# Patient Record
Sex: Female | Born: 1970
Health system: Western US, Academic
[De-identification: ages and names within clinical notes are randomized; demographics above are authoritative.]

---

## 2016-11-03 ENCOUNTER — Telehealth: Payer: BLUE CROSS/BLUE SHIELD

## 2018-05-02 DEATH — deceased

## 2018-05-15 ENCOUNTER — Telehealth: Payer: BLUE CROSS/BLUE SHIELD

## 2018-05-15 NOTE — Telephone Encounter
Dr.Peter Nalos  [phone]    Called because he wants patient to have defibrillator replaced by Dr. Micheline Maze.  Will be sending over records and is requesting patient be seen as soon as possible.    Thank you,    Maralyn Sago

## 2018-05-16 NOTE — Telephone Encounter
Reply by: Nadene Rubins     Spoke to patient, we are coordinating a follow up appt and gen change for the last week of January or the first week of February 2020.

## 2018-05-16 NOTE — Telephone Encounter
Hi Np's    Please review records. Patient was last seen 2008    Please advise    Thank you,    Maralyn Sago

## 2018-05-28 ENCOUNTER — Telehealth: Payer: BLUE CROSS/BLUE SHIELD

## 2018-05-28 NOTE — Telephone Encounter
Left v-mail for patient in regards to authorization for procedure  Asked patient to return call.     Pcc- Please transfer call to back line.    Thank you,   Massie Bougie

## 2018-05-30 NOTE — Telephone Encounter
Left VM for patient 05/29/18 after speaking to [employer] with referring MD's office.   Left another message today 1/29. Made patient aware authorization is pending approval with referring MD.   Asked patient to return call.     Thanks,   [employer]

## 2018-05-31 NOTE — Telephone Encounter
Spoke to pt, let her know I reached to ref md's office.   Per Don Perking request was submitted 05/30/18, per Bonita Quin she spoke to patient and provided update and asked pt to follow up with insurance.     Auth pending approval.     Massie Bougie

## 2018-06-01 NOTE — Telephone Encounter
Call taken by [facility].

## 2018-06-01 NOTE — Telephone Encounter
PDL Call to Practice    MD: No pcp    Reason for Call: Patient is requesting to speak to [facility] in regards to auth for upcoming procedure on Tuesday 06/05/2018.    Appointment Related? No    ? If so, time preference?    Call Received by Practice Representative: Belinda    Patient was advised to seek emergency services if conditions are urgent or emergent.

## 2018-06-04 ENCOUNTER — Ambulatory Visit: Payer: BLUE CROSS/BLUE SHIELD

## 2018-06-04 ENCOUNTER — Inpatient Hospital Stay: Payer: PRIVATE HEALTH INSURANCE

## 2018-06-04 DIAGNOSIS — I472 Ventricular tachycardia: Secondary | ICD-10-CM

## 2018-06-04 DIAGNOSIS — Z8679 Personal history of other diseases of the circulatory system: Secondary | ICD-10-CM

## 2018-06-04 DIAGNOSIS — Z95810 Presence of automatic (implantable) cardiac defibrillator: Secondary | ICD-10-CM

## 2018-06-04 DIAGNOSIS — Z4502 Encounter for adjustment and management of automatic implantable cardiac defibrillator: Secondary | ICD-10-CM
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
## 2018-06-04 NOTE — Progress Notes
SEE MEDIA SECTION FOR ACTUAL PRINTOUT    DATE OF SERVICE: 06/04/2018  ICD: [employer]: Teligen 100 E110  Serial # [id number]  Indication: VT.  Implant Date: 03/01/2007    Battery Longevity: < 3 months    Thresholds:   Underlying rhythm: Sinus Rhythm at 60 bpm  P wave: 1.1 mV.   R wave: 4.0 mV.   RA lead impedance: 572 ohms   RV lead impedance: 442 ohms.   HV impedance: 62 ohms.   RA capture voltage: 0.7 V at 0.5 ms.   RV capture voltage: 1.3 V at 0.5 ms.     Since 05/15/18:  Afib/AFL/SVT episodes: 2 ATR episodes for atrial lead noise. Not reproducible my manual manipulation.  VT/Vfib episodes: None.  AP = 72%; VP = <1%.    Settings:   DDD 70-130 ppm.   Maximum Sensor rate: 130 ppm.     Changes made this session: Normal Brady V-Amplitude: 2.5 V ??? 2.6 V.    Impression:   1. Normal ICD function.   2. Battery low.  3. Atrial lead noise.    Plan:   1. Patient to see Dr. Micheline Maze with printout for arrhythmia management. Please see separate EP note.  2. Patient to have generator change tomorrow.    I agree with the technologist's note.    Sherald Barge, MD  Assistant Professor of Medicine  Cardiac Electrophysiology  [facility]    Author: Sherald Barge, MD 06/04/2018 5:17 PM

## 2018-06-04 NOTE — Patient Instructions
Proceed to [address] [facility] for your chest x-ray

## 2018-06-05 ENCOUNTER — Inpatient Hospital Stay
Admit: 2018-06-05 | Discharge: 2018-06-05 | Disposition: A | Discharge status: Home / Self Care | Payer: BLUE CROSS/BLUE SHIELD | Source: Home / Self Care

## 2018-06-05 ENCOUNTER — Ambulatory Visit: Payer: BLUE CROSS/BLUE SHIELD

## 2018-06-05 DIAGNOSIS — Z95810 Presence of automatic (implantable) cardiac defibrillator: Secondary | ICD-10-CM

## 2018-06-05 DIAGNOSIS — Z4509 Encounter for adjustment and management of other cardiac device: Secondary | ICD-10-CM

## 2018-06-05 LAB — CREATININE: CREATININE: 0.67 mg/dL (ref 0.60–1.30)

## 2018-06-05 LAB — Hematocrit: HEMATOCRIT: 41.3 (ref 34.9–45.2)

## 2018-06-05 LAB — PRECISION MEDICINE BLOOD DRAW

## 2018-06-05 LAB — Urea Nitrogen: UREA NITROGEN: 18 mg/dL (ref 7–22)

## 2018-06-05 LAB — WBC Count, ANES: WHITE BLOOD CELL COUNT: 6.6 10*3/uL (ref 4.16–9.95)

## 2018-06-05 LAB — Electrolyte Panel: TOTAL CO2: 21 mmol/L (ref 20–30)

## 2018-06-05 LAB — APTT: APTT: 26.8 s (ref 24.4–36.2)

## 2018-06-05 LAB — Hemoglobin: HEMOGLOBIN: 13.3 g/dL (ref 11.6–15.2)

## 2018-06-05 LAB — Platelet Count: PLATELET COUNT, AUTO: 235 10*3/uL (ref 143–398)

## 2018-06-05 LAB — Glucose, Whole Blood: GLUCOSE, WHOLE BLOOD: 85 mg/dL (ref 65–99)

## 2018-06-05 LAB — Prothrombin Time Panel: INR: 1 s (ref 11.5–14.4)

## 2018-06-05 MED ORDER — OXYCODONE-ACETAMINOPHEN 5-325 MG PO TABS
1 | ORAL_TABLET | Freq: Four times a day (QID) | ORAL | 0 refills | 3.00 days | Status: AC | PRN
Start: 2018-06-05 — End: ?
  Filled 2018-06-06: qty 10, 3d supply, fill #0

## 2018-06-05 MED ORDER — DOXYCYCLINE HYCLATE 100 MG PO CAPS
100 mg | ORAL_CAPSULE | Freq: Two times a day (BID) | ORAL | 0 refills | 7.00000 days | Status: AC
Start: 2018-06-05 — End: ?
  Filled 2018-06-06: qty 14, 7d supply, fill #0

## 2018-06-05 MED ADMIN — PROPOFOL 200 MG/20ML IV EMUL: @ 16:00:00 | Stop: 2018-06-05 | NDC 63323026929

## 2018-06-05 MED ADMIN — VASOPRESSIN 20 UNIT/ML IV SOLN: @ 17:00:00 | Stop: 2018-06-05 | NDC 42023016425

## 2018-06-05 MED ADMIN — MIDAZOLAM HCL 10 MG/10ML IJ SOLN: @ 17:00:00 | Stop: 2018-06-05 | NDC 00409258705

## 2018-06-05 MED ADMIN — CEFAZOLIN SODIUM 1 G IJ SOLR: @ 16:00:00 | Stop: 2018-06-05 | NDC 00143992490

## 2018-06-05 MED ADMIN — OXYCODONE-ACETAMINOPHEN 5-325 MG PO TABS: 1 | ORAL | @ 18:00:00 | Stop: 2018-06-06 | NDC 00406051262

## 2018-06-05 MED ADMIN — ACETAMINOPHEN 500 MG PO TABS: 1000 mg | ORAL | @ 14:00:00 | Stop: 2018-06-05 | NDC 00904673061

## 2018-06-05 MED ADMIN — FAMOTIDINE 20 MG/2ML IV SOLN: 20 mg | INTRAVENOUS | @ 15:00:00 | Stop: 2018-06-05 | NDC 00641602225

## 2018-06-05 NOTE — Brief Op Note
Brief Operative/Procedure Note    Patient: Adrienne Love    Date of Operation(s)/Procedure(s): 06/05/2018    Pre-op Diagnosis: Implantable cardioverter-defibrillator (ICD) at end of battery life [Z45.09],       Post-op Diagnosis: same as above    Operation(s)/Procedure(s):  REPLACE ICD GENERATOR **BSC**    Surgeon(s) and Role:     Caryl Comes., MD - Primary     * Cira Rue, Arnoldo Hooker., MD - Surgeon 1st Assist - Fellow    Anesthesia Staff and Role:  Anesthesia Resident: Margaretmary Bayley., MD  Anesthesiologist: Harriett Rush., MD, MS    Anesthesia Type:   General, MAC    Pre-Op Medications: Cefazolin 2gm IV    Estimated Blood Loss: Minimal    Findings: s/p successful ICD generator change    Complications: None; patient tolerated the operation(s)/procedure(s) well.                 Specimens:   ID Type Source Tests Collected by Time   1 : Disposable only Tissue Heart TISSUE EXAM/FOREIGN BODY (AP) Caryl Comes., MD 06/05/2018 [phone]       Drains:    none         Staff and Role:   Circulating Nurse: Natale Lay, RN  Scrub Person: Doronio, Lacinda Axon  Monitor Tech: Bea Graff H. Cira Rue, MD    Date: 06/05/2018  Time: 9:04 AM

## 2018-06-05 NOTE — Op Note
Patient doing well.  Procedure site dry and intact.  No complaint of pain.  Vital signs stable within baseline  Discharged instructions was given to the patient verbalized understanding.  Patient stable.

## 2018-06-05 NOTE — Op Note
ICD GENERATOR REPLACEMENT    DATE OF PROCEDURE: 06/05/2018    PATIENT: Adrienne Love  MRN: [id number]  DOB: [birth year]    REFERRING PRACTITIONER: Sudie Bailey., MD  PRIMARY CARE PROVIDER: Hortense Ramal, FNP    ATTENDING PHYSICIAN: Lanier Clam, MD, PhD  ASSISTING PHYSICIAN: Jeb Levering, MD    PRE-PROCEDURE DIAGNOSIS:  1. History of ventricular tachycardia  2. ICD generator at elective replacement interval    POST-PROCEDURE DIAGNOSIS: Successful ICD generator change    OPERATION TITLE:  1. ICD implantation (pulse generator change)  2. Device programming and interrogation  3. Fluoroscopy supervision and interpretation    ANESTHESIA: MAC per attending anesthesiologist    CLINICAL HISTORY:  This is a 48 year old patient with history of ventricular tachycardia who underwent ICD implantation to reduce risk of sudden death.  Battery is at elective replacement interval, and patient is therefore referred for generator change.     PHYSICIAN DOCUMENTATION OF INFORMED CONSENT:  Potential risks associated with the procedure were explained to the patient. Such risks include, but are not limited to: lead dislodgement, cardiac tamponade, cardiac perforation, pneumothorax, bleeding requiring transfusion, death, elevated thresholds, erosion through the skin, fibrotic tissue formation, infection, lead fracture, local tissue reaction, muscle and nerve stimulation, myopotential sensing, pacemaker-mediated tachycardia, pacemaker migration, transvenous lead-related thrombosis, random component or battery failures that cannot be predicted and can lead to failure to deliver appropriate therapy. Alternatives to the proposed procedure were also explained to the patient. The patient is cognizant of these risks and signed the informed written consent.     DESCRIPTION OF PROCEDURE:   2g of Ancef was administered intravenously prior to the start of the procedure.      The patient was prepped and draped in the usual sterile fashion. 2% lidocaine was used for local anesthesia. A 6 cm incision was made just under the prior incision. Electrocautery and blunt dissection were used to locate and enter the device pocket. The old device was disconnected and lead parameters were measured.  The new device was connected and lead parameters were measured again. Using blunt dissection, the pocket was broken open. The pocket was irrigated with antibiotic-containing solution and sterile saline. Adequate electrode parameters were confirmed prior to the attachment of the pulse generator to the leads. The leads and generator were then inserted into the pocket.    CLOSURE: 2-0 Vicryl was used for fascial closure, 3-0 Vicryl was used for subcutaneous closure, 4-0 Vicryl was used for subcuticular closure. The incision was then protected with gauze and clear adhesive dressing was applied.    COMPLICATIONS: None.    FLUOROSCOPY: 0.22min, 6mG y, 75 microGym2.    ESTIMATED BLOOD LOSS: <1mL.    IMPLANTED MATERIALS:       FINAL DIAGNOSES:   1. History of ventricular tachycardia.  2. Successful ICD generator change.  3. Defibrillation threshold was not tested as risks outweighed benefits.    PLAN:  1. Observation for 3 hours post-procedure.  2. Follow-up in 1-2 weeks for wound check.  3. Doxycycline 100 mg twice daily for 1 week.    Jeb Levering, MD  Cardiac Electrophysiology Fellow

## 2018-06-05 NOTE — H&P
UPDATED H&P REQUIREMENT    For [place] and [facility]    WHAT IS THE STATUS OF THE PATIENT'S MOST CURRENT HISTORY AND PHYSICAL?   - The most current H&P was performed within the past 24 hours. No additional updated H&P documentation is necessary.     REFER TO MEDICAL STAFF POLICIES REGARDING PRE-PROCEDURE HISTORY AND PHYSICAL EXAMINATION AND UPDATED H&P REQUIREMENTS BELOW:    [facility] and [facility] Medical Staff Policy 200 - For Patients Undergoing Procedures Requiring Moderate or Deep Sedation, General Anesthesia or Regional Anesthesia    Contents of a History and Physical Examination (H&P):    The H&P shall consist of chief complaint, history of present illness, allergies and medications, relevant social and family history, past medical history, review of systems and physical examination, and assessment and plan appropriate to the patient's age.    For Patients Undergoing Procedures Requiring Moderate or Deep Sedation, General Anesthesia or Regional Anesthesia:    1. An H&P shall be performed within 24 hours prior to the procedure by a qualified member of the medical staff or designee with appropriate privileges, except as noted in item 2 below.    2. If a complete history and physical was performed within thirty (30) calendar days prior to the patient's admission to the Medical Center for elective surgery, a member of the medical staff assumes the responsibility for the accuracy of the clinical information and will need to document in the medical record within twenty-four (24) hours of admission and prior to surgery or major invasive procedure, that they either attest that the history and physical has been reviewed and accepted, or document an update of the original history and physical relevant to the patient's current clinical status.    3. Providing an H&P for  patients undergoing surgery under local anesthesia is at the discretion of the Attending Physician.     4. When a procedure is performed by a dentist, podiatrist or other practitioner who is not privileged to perform an H&P, the anesthesiologist's assessment immediately prior to the procedure will constitute the 24 hour re-assessment.The dentist, podiatrist or other practitioner who is not privileged to perform an H&P will document the history and physical relevant to the procedure.    5. If the H&P and the written informed consent for the surgery or procedure are not recorded in the patient's medical record prior to surgery, the operation shall not be performed unless the attending physician states in writing that such a delay could lead to an adverse event or irreversible damage to the patient.    6. The above requirements shall not preclude the rendering of emergency medical or surgical care to a patient in dire circumstances.

## 2018-06-06 DIAGNOSIS — Z8679 Personal history of other diseases of the circulatory system: Secondary | ICD-10-CM

## 2018-06-06 DIAGNOSIS — Z95810 Presence of automatic (implantable) cardiac defibrillator: Secondary | ICD-10-CM

## 2018-06-07 ENCOUNTER — Telehealth: Payer: BLUE CROSS/BLUE SHIELD

## 2018-06-07 LAB — Tissue Exam

## 2018-06-07 NOTE — Telephone Encounter
I spoke to pt about the letter.  I let her know that I would message the NPs about drafting one and sending it to her work  Thank you

## 2018-06-07 NOTE — Telephone Encounter
PDL Call to Practice    MD:Dr. Micheline Maze     Reason for Call: Patient had an ICD implant on Tuesday and was scheduled to return to work today. Patient needs a letter clearing her to go back sent to Fax: [phone] .    Appointment Related? no    ? If so, time preference?    Call Received by Practice Representative: Zollie Beckers    Patient was advised to seek emergency services if conditions are urgent or emergent.

## 2018-06-11 NOTE — Telephone Encounter
Reply by: Lillia Dallas. Pascale Maves    Hi Walter,    Yes I faxed the letter on Thursday.     Thanks,  Carney Bern

## 2018-06-11 NOTE — Telephone Encounter
Hi Adrienne Love    I wanted to know if this was done so I can close it  Thank you

## 2018-06-15 ENCOUNTER — Telehealth: Payer: BLUE CROSS/BLUE SHIELD

## 2018-06-15 NOTE — Telephone Encounter
Call Back Request    MD:  Dr.Boyle    Reason for call back: Patient would like to know when she can remove bandage left after ICD surgery.    Any Symptoms:  []  Yes  [x]  No      ? If yes, what symptoms are you experiencing:    o Duration of symptoms (how long):    o Have you taken medication for symptoms (OTC or Rx):      Patient or caller has been notified of the 24-48 hour turnaround time.

## 2018-06-15 NOTE — Telephone Encounter
Reply by: Lillia Dallas. Irvin Bastin    Called and spoke to Ms Gustaveson.  Let her know to keep on the dressing on until her incision check appt.  If she can get into see her primary cardiologist on Tuesday 2/18 to check the incision, she does not need to come on 2/19 but will keep this appt scheduled for now.

## 2018-06-20 ENCOUNTER — Ambulatory Visit: Payer: PRIVATE HEALTH INSURANCE

## 2019-08-02 ENCOUNTER — Ambulatory Visit: Payer: BLUE CROSS/BLUE SHIELD

## 2019-08-02 DIAGNOSIS — Z23 Encounter for immunization: Secondary | ICD-10-CM

## 2022-08-02 ENCOUNTER — Ambulatory Visit: Payer: BLUE CROSS/BLUE SHIELD

## 2022-08-02 DIAGNOSIS — M1711 Unilateral primary osteoarthritis, right knee: Secondary | ICD-10-CM

## 2022-08-02 DIAGNOSIS — M25561 Pain in right knee: Secondary | ICD-10-CM

## 2022-08-02 NOTE — Progress Notes
Date: 08/02/2022  Name: Adrienne Love   MRN#: [id number]   DOB: 1970-05-04   Age: 52 y.o.  ___________________________________________________________________________________________________________________________________    Dr. Leane Call, DO    The patient was seen at the [facility] office.    REASON FOR VISIT: Right knee    INTERIM HISTORY: The patient is a 52 y.o. female that comes to our office for orthopaedic evaluation of her ongoing symptoms.  Patient was initially under the care of Dr. Molly Maduro regarding a torn meniscus of the right knee.  She underwent arthroscopic intervention that worsened her symptoms. Most recently she injured her right knee while out cycling in 2020.  Since that time, the patient says that the pain is dull to sharp and rates the severity as 8/10. The pain is located laterally about the knee without radiation. The pain is aggravated by activity and improves with rest. The patient has difficulties sitting, standing, and walking for long periods of time. The pain is interfering with activities of daily living. The patient affirms associated clicking and popping of the knee and affirms instability with walking.     She also admits feeling weakness down her right lower extremity, and numbness and tingling to all the digits of bilateral feet.     The patient?s symptoms have worsened and were not relieved with conservative treatments as noted below:    PRIOR TREATMENT HAS INCLUDED:   []   No previous treatment   [x]   Ice/Heat   [x]   NSAID   [x]   Physical therapy   [x]   Cortisone injection- 2 months of relief with Dr. Jacqlyn Krauss   [x]   Viscous-supplementation injections- 6 months of relief, with Dr. Jacqlyn Krauss 2 months ago.  []   Bracing  []   Pain meds     Past Medical History:   Past Medical History:   Diagnosis Date    Cancer (HCC/RAF)     breast    Pacemaker 2008       Past Surgical History:   Past Surgical History:   Procedure Laterality Date    BREAST LUMPECTOMY Right     CHOLECYSTECTOMY  2000 foot realignment Left 2014    GASTRIC BYPASS  2000    HYSTERECTOMY  2010    REDUCTION MAMMAPLASTY  2010       Medications:   Current Outpatient Medications   Medication Sig    flecainide 100 mg tablet Take 1 tablet (100 mg total) by mouth two (2) times daily.    Levalbuterol HCl (XOPENEX IN) Inhale as needed for.    levocetirizine 5 mg tablet Take 1 tablet (5 mg total) by mouth daily.    losartan 25 mg tablet Take 1 tablet (25 mg total) by mouth daily.    metoprolol tartrate 50 mg tablet Take 1 tablet (50 mg total) by mouth two (2) times daily.    omeprazole 20 mg DR capsule Take 1 capsule (20 mg total) by mouth daily.    triamterene-hydroCHLOROthiazide (DYAZIDE) 37.5-25 mg capsule Take 1 capsule by mouth daily.    doxycycline 100 mg capsule Take 1 capsule (100 mg total) by mouth two (2) times daily. (Patient not taking: Reported on 08/02/2022.)    montelukast 10 mg tablet Take 10 mg by mouth daily. (Patient not taking: Reported on 08/02/2022.)    oxyCODONE-acetaminophen 5-325 mg tablet Take 1 tablet by mouth every six (6) hours as needed for pain (Patient not taking: Reported on 08/02/2022.)     No current facility-administered medications for this visit.  Allergies:   Allergies   Allergen Reactions    Allergies Imported From [employer]      Povidone-Iodine, Neomycin/Polymyxin B GU       Social history:   Social History     Tobacco Use    Smoking status: Former    Smokeless tobacco: Never       Family history: No family history on file.    REVIEW OF SYSTEMS: The 14-point review of systems as documented today in the medical record is remarkable for the positive orthopedic problems discussed above and their relevance was considered with respect to Constitutional, ENT, Cardiovascular, GU, Skin, Neurologic, Endocrine, Hematologic, Psychiatric, Gastrointestinal, Respiratory, Eyes and Allergic/Immunologic systems.      PHYSICAL EXAM:   Constitutional: Adrienne Love is a 52 y.o. female in no acute distress.   Vitals: 08/02/22 0828   Weight: (!) 229 lb (103.9 kg)   Height: 5' 5'' (1.651 m)    Body mass index is 38.11 kg/m?Marland Kitchen  Psyc: The patient is alert and oriented x3 with a normal mood and affect.   HENT: AT/NC; hearing intact   Eyes: PERRLA. Extra-ocular movements are intact.   Skin: Normal temperature. There are no rashes, ulcerations, or lesions.   Lymphatic: No induration or enlargement.   Extremities: No swelling or calf tenderness. clubbing. Cyanosis.  Gait:  [x]  Antalgic []  Normal []  Trendelenburg   Valgus thrust, right knee worse than left.   Assistive devices: none     KNEE EXAM  Bilateral knees: The patient is grossly neurologically intact from L2-S1. 2+ deep tendon reflexes of the patellofemoral and achilles tendons. 2+ posterior tibialis and dorsalis pedis pulses. No lymphedema.  Skin has no lesions. Compartments are soft.     Right Knee Exam:   Healed arthroscopic portals  Erythema present:                  []  Yes [x]  No   Bruising or ecchymosis:          []  Yes [x]  No   Palpable masses present:      []  Yes [x]  No   [x]  No effusion present   []  Small Effusion present []  Moderate Effusion present      []  Large effusion  present  Palpation tenderness severity: []  None   [x]  Mild   []  Moderate    []  Severe  Palpable tenderness location:              []  N/A              [x]  Medial joint line              []  Lateral joint line               []  Posteromedial joint line              []  Posterolateral joint line              []  Posterior knee              []  Patellofemoral joint line              []  Diffusely               []  Quadriceps tendon              []  Patellar tendon              [x]  Pes anserine bursa              []   Patella              []  Diffusely        Range of motion is 0? extension to 115? flexion  Guarding and crepitus:           []  Yes [x]  No   Flexion contractures:              []  Yes [x]  No   The knee is unstable with 1+ laxity with Varus and Valgus stress at 0, 30, 90?Marland Kitchen     Special tests:   McMurray's test:   []  Positive [x]  Negative []  Not Tested  Reverse McMurray's test: [x]  Positive []  Negative []  Not Tested  Apley's test:   []  Positive [x]  Negative []  Not Tested  Lachman's test:  []  Positive [x]  Negative []  Not Tested  Anterior drawer test:  []  Positive [x]  Negative []  Not Tested  Posterior drawer test:  []  Positive [x]  Negative []  Not Tested  Patellar apprehension test: []  Positive [x]  Negative []  Not Tested  Patellar grind test:  []  Positive [x]  Negative []  Not Tested  Muscle Strength of the knee is 5/5. Patellar tracking is normal    Left Knee Exam:   No surgical incisions or scars are noted.  Recurvatum deformity at least 5 degrees  Erythema present:                  []  Yes [x]  No   Bruising or ecchymosis:          []  Yes [x]  No   Palpable masses present:      []  Yes [x]  No   [x]  No effusion present   []  Small Effusion present []  Moderate Effusion present      []  Large effusion  present  Palpation tenderness severity: []  None   [x]  Mild   []  Moderate    []  Severe  Palpable tenderness location:              []  N/A              [x]  Medial joint line              []  Lateral joint line               []  Posteromedial joint line              []  Posterolateral joint line              []  Posterior knee              []  Patellofemoral joint line              []  Diffusely               []  Quadriceps tendon              []  Patellar tendon              []  Pes anserine bursa              []  Patella              []  Diffusely        Range of motion is 0? extension to 115? flexion  Guarding and crepitus:           []  Yes [x]  No   Flexion contractures:              []  Yes [x]  No   The knee is  unstable with 2+ laxity with Varus stress at 0, 30, 90?Marland Kitchen     Special tests:   McMurray's test:   []  Positive [x]  Negative []  Not Tested  Reverse McMurray's test: []  Positive [x]  Negative []  Not Tested  Apley's test:   []  Positive [x]  Negative []  Not Tested  Lachman's test:  []  Positive [x]  Negative []  Not Tested  Anterior drawer test:  []  Positive [x]  Negative []  Not Tested  Posterior drawer test:  []  Positive [x]  Negative []  Not Tested  Patellar apprehension test: []  Positive [x]  Negative []  Not Tested  Patellar grind test:  []  Positive [x]  Negative []  Not Tested  Muscle Strength of the knee is 5/5. Patellar tracking is normal    RADIOGRAPHS: 4 views of right knee and 3 view(s) comparison of the opposite knee were ordered, obtained and reviewed by me here at [facility] today and reviewed with the patient. They show severe osteoarthritis with lateral joint space narrowing, subchondral sclerosis, and periarticular spurring.     IMPRESSION:   Right knee severe osteoarthritis  Pacemaker/Defibrillator- Dr. Russ Halo- non MRI compatible   Ventricular tachycardia  Osteoporosis/Osteopenia   Suspicion of bilateral lumbosacral radiculopathy, L4-L5 radiculopath  Suspicion of neuropathy  Gastric bypass  Bilateral hip bursitis    PLAN: We had a lengthy discussion with the patient today regarding her pain. I discussed with the patient their physical exam findings, radiographs and imaging, above diagnoses, and current treatment options along with their risks and benefits.     Again, her subjective complaints and objective findings are consistent with severe lateral compartmental osteoarthritis of the right knee. The previous visco-supplementation injection helped her tremendously, but feels the efficacy of the visco-supplementation is waning. As such we would like to to request authorization for a right knee Zilretta injection as she has yet to attempt this. She feels the previous cortisone injections only lasted 2 months of relief.     In addition, her subjective complaints and objective findings are consistent with lumbosacral radiculitis, with likely L4-L5 radiculopathy. Regarding her bilateral numbness and tingling in both feet, she was recommended to follow up with Dr. [name] or Dr. Kathrene Alu. She understands she has concomitant issues, including back issues that can contribute to instability of the right knee. She was told her pacemaker was non-MRI compatible. She continues to have bilateral foot numbness and tingling 4 years, with right sided weakness than left.     Her left knee complaints are tolerable at this time.     Patient educated on the importance of weight loss through proper, improved nutrition and increased cardiovascular exercise 30-45 minutes for 5-6 days per week. Patient educated on low impact exercises, such as elliptical machine, stationary bike, and aquatic therapy/pool therapy.    The patient's x-rays demonstrate poor bone quality, likely osteopenic or osteoporotic in nature. As such I recommended Calcium 1200 mg daily, Vitamin D 5,000 IU daily and a DEXA Scan ordered by myself.     Lastly, regarding her bilateral hip bursitis, we will obtain new radiographs at the next appointment. She was given a handout for PRP injection as she has attempted multiple cortisone bursae injections with varying relief.      NEXT APPOINTMENT:  Follow up after authorization for a right knee Zilretta injection, DEXA scan results and obtain bilateral hip radiographs at the next visit.     Thank you for allowing me to participate in Holle Sprick Pena?s care. Please do not hesitate to contact me if you have any questions or  concerns.         Leane Call, D.O.  Adult Reconstruction Specialist  ORTHOPEDIC SURGERY  08/02/2022

## 2022-08-05 NOTE — Progress Notes
DATE: 08/11/2022   NAME: Adrienne Love   MRN: [id number]   DOB: 11/14/1970   AGE: 52 y.o.     Patient was seen in our [facility] by: Adrienne Love. [name], MD    Chief Complaint:  No chief complaint on file.      History:   This is a  52 y.o. year old female who presents with:   ***  The patient was referred by knee specialist Dr. [name] for further evaluation of the lumbar spine as the etiology of her persistent numbness and tingling of the feet.     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 She does also have a history of gastric bypass and is not able to take oral anti-inflammatories.    Pain or symptoms are located in the ***.   They radiate to the ***.  They state symptoms have been present for ***.  Pain is rated at {KAL 0-10:28539} out of 10.  Pain is described as *** and overall is ***.  Symptoms are better with ***.  Symptoms are worse with ***.  Symptoms {Blank single:19197::''are'',''are not''} affecting their quality of life.    Claudicatory or Myelopathic symptoms {Blank single:1919::''are'', ''are not''} present.     They deny numbness, tingling, weakness, bowel or bladder incontinence, saddle anesthesia, fevers, chills, unintentional weight loss, night pain, hand clumsiness, difficulty with buttons, balance issues, or night sweats except as noted above.    PRIOR TREATMENT HAS INCLUDED:   []   No previous treatment   []   Ice/Heat   [x]   Activity Modification   []   NSAID   []   Gabapentin  []   Muscle relaxer  []   Oral steroids  []   Home exercise regimen  []   Physical therapy   []   Cortisone/epidural injection   []   Bracing  []   Pain medication   []   Acupuncture  []   Chiropractic care    Injections History:  ***    Spine Surgical History:  ***    PAST MEDICAL HISTORY:   Past Medical History:   Diagnosis Date    Anemia     Arthritis     Cancer (HCC/RAF)     breast    DVT (deep venous thrombosis) (HCC/RAF)     GERD (gastroesophageal reflux disease)     Hypertension Pacemaker 2008         PAST SURGICAL HISTORY:   Past Surgical History:   Procedure Laterality Date    BREAST LUMPECTOMY Right     CHOLECYSTECTOMY  2000    foot realignment Left 2014    GASTRIC BYPASS  2000    HYSTERECTOMY  2010    REDUCTION MAMMAPLASTY  2010         CURRENT MEDICATIONS:      Current Outpatient Medications:     doxycycline 100 mg capsule, Take 1 capsule (100 mg total) by mouth two (2) times daily. (Patient not taking: Reported on 08/02/2022.), Disp: 14 capsule, Rfl: 0    flecainide 100 mg tablet, Take 1 tablet (100 mg total) by mouth two (2) times daily., Disp: , Rfl:     Levalbuterol HCl (XOPENEX IN), Inhale as needed for., Disp: , Rfl:     levocetirizine 5 mg tablet, Take 1 tablet (5 mg total) by mouth daily., Disp: , Rfl:     losartan 25 mg tablet, Take 1 tablet (25 mg total) by mouth daily., Disp: , Rfl:     metoprolol tartrate 50  mg tablet, Take 1 tablet (50 mg total) by mouth two (2) times daily., Disp: , Rfl:     montelukast 10 mg tablet, Take 10 mg by mouth daily. (Patient not taking: Reported on 08/02/2022.), Disp: , Rfl:     omeprazole 20 mg DR capsule, Take 1 capsule (20 mg total) by mouth daily., Disp: , Rfl:     oxyCODONE-acetaminophen 5-325 mg tablet, Take 1 tablet by mouth every six (6) hours as needed for pain (Patient not taking: Reported on 08/02/2022.), Disp: 10 tablet, Rfl: 0    triamterene-hydroCHLOROthiazide (DYAZIDE) 37.5-25 mg capsule, Take 1 capsule by mouth daily., Disp: , Rfl:       ALLERGIES:    Allergies   Allergen Reactions    Allergies Imported From [facility]      Povidone-Iodine, Neomycin/Polymyxin B GU       SOCIAL HISTORY:    Social History     Socioeconomic History    Marital status: Legally Separated   Tobacco Use    Smoking status: Former    Smokeless tobacco: Never        REVIEW OF SYSTEMS: The review of systems as documented today in the medical record is remarkable for the positive orthopedic problems discussed above and is available for review in the patient's chart.    PHYSICAL EXAM:    There were no vitals filed for this visit.  There is no height or weight on file to calculate BMI.  GENERAL: Well-developed, well-nourished, appears stated age, no apparent distress    PULMONARY: Nonlabored respirations.      CARDIAC: Palpable radial pulses with regular rate.     SKIN: {Blank single:19197::''There are'',''No''} previous spine incisions are present.     GAIT: Narrow-based, reciprocal gait. {Blank single:19197::''***'',''Normal heel, toe, and tandem walk''}.    LUMBAR SPINE EXAMINATION:    Gait and Station  The patient arises from seated to standing without difficulty***    Range Of Motion     Pain with Flexion: ***  Pain with Extension: ***  Pain with Rotation: ***    Motor Strength   Right   Left  Hip Flexors     5   5  Quadriceps     5   5  Tibialis Anterior    5   5  Extensor Hallucis Longus   5   5  Ankle Plantarflexors     5   5      Sensation  Light touch sensation is intact in both lower extremities ***    Reflexes    Right  Left  Patellar      2  2  Achilles      2  2    Tenderness   Lumbosacral midline  None   Paraspinal muscles    None   Greater Trochanteric Bursa None   SI Joints    None      Long Track Signs  Clonus    Negative   Babinski    Negative    SPECIAL TESTING:  Hip Exam: Not Performed  Knee Exam: Not performed  Straight Leg Raise: ***      IMAGING & RADIOGRAPHS:  X-RAYS: Taken today at our [facility] office and reviewed by me show: 5 Views of Lumbar Spine: There are five lumbar vertebrae. There is normal lumbar lordosis. Coronal alignment is within normal limits. Vertebral body heights are within normal limits. Disc heights are maintained at all levels. There is no evidence of spondylolysis or spondylolisthesis.  LUMBAR SPINE MRI: MRI of the lumbar spine was performed at {Blank single:19197::''[facility]'',''[facility]'',''[facility]'',''[facility]'',''an outside facility'',''***''} on ***. MRI is reviewed and interpreted by myself from an orthopedic standpoint in office today with the patient.  MRI reveals ***.        Diagnosis:  No diagnosis found.     MEDICAL DECISION MAKING & PLAN:  I had an extensive discussion with the patient regarding their symptoms, physical exam findings, imaging findings, and the above-mentioned diagnoses. We discussed further diagnostic testing if required and multiple treatment options including non-surgical and surgical treatment options. Extensive patient specific education and counseling was provided regarding the above mentioned diagnoses.     ***    Medical management is initiated in office today. A prescription of:  Requested Prescriptions      No prescriptions requested or ordered in this encounter        This prescription was sent electronically to the pharmacy.  Medication counseling was performed.    It was agreed proceed with:     []  Physical therapy via a spine neutral approach with modalities.   []  Home exercise program and activity modification including low-impact activities and core strengthening.  []  NSAIDS if there are no GI or kidney issues.   []  Neuromodulator medications- a prescription for Gabapentin 300 mg TID was provided to the patient.   []  Muscle relaxer - Flexeril   []  Pain medications including Tramadol or narcotics. A signed opoid treatment agreement was obtained.    []  Oral steroid dose pack.   []  Topical creams, ointments, and medications.   []  Weight loss.   []  Smoking cessation.   []  Complementary and alternative treatment options such as acupuncture and/or chiropractic treatments and/or experimental treatments such as stem cell injections.  []  Diagnostic and therapeutic lumbar transforaminal epidural steroid injection at   []  Diagnostic and therapeutic cervical/lumbar epidural steroid injection at   []  Diagnostic and therapeutic cervical selective nerve root steroid injection at   []  Diagnostic and therapeutic injection at   []  Pain management referral for evaluation and treatment.   []  MRI of ***  to evaluate for neurologic compression.   []  CT ***  to evaluate for pseudarthrosis.   []  Electrodiagnostic testing (EMG/NCS).    []  Consultation referral to   []  Work restrictions including  []   Bracing as needed.  []  Surgical treatment with ***. Our appropriate preoperative protocol will be initiated.     All questions were answered to the patient?s satisfaction.      TIME ALLOCATION     I spent total *** minutes formulating today's visit which included:     [x] Preparing to see the patient (e.g., review of tests)   [x] Obtaining and/or reviewing separately obtained history   [x] Performing a medically appropriate examination and/or evaluation   [x] Counseling and educating the patient/family/caregiver   [x] Documenting clinical information in the electronic or other health record   [x] Independently interpreting results (not separately reported) and communicating results to the patient/family/caregiver   [] Care coordination (not separately reported)    FOLLOWUP: The patient will follow up in {Blank single:19197::''four weeks'',''six weeks''}.    At that visit we will get the following xrays: ***  The patient was referred by Dr. Leane Call and today's clinic note will be sent to this provider.    Lenord Fellers, MD  Orthopedic Spine Surgery  [facility]

## 2022-08-11 ENCOUNTER — Ambulatory Visit: Payer: BLUE CROSS/BLUE SHIELD

## 2022-08-16 NOTE — Progress Notes
DATE: 08/18/2022   NAME: Adrienne Love   MRN: [id number]   DOB: [birth year]   AGE: 52 y.o.     Patient was seen in our [facility] by: [name]. [name], MD    Chief Complaint:  Chief Complaint   Patient presents with    Lower Back - Pain       History:   This is a  52 y.o. year old female who presents with low back pain radiating down the right lateral lower extremity currently, but she states the pain does also occasionally radiate down the lateral left lower extremity. She does also report numbness to the feet bilaterally. The patient initially thought her symptoms were related to her right hip and knee and has been under the care of Dr. [name] who referred the patient for further evaluation of the lumbar spine as the etiology of her complaints. She has been treated with trochanteric bursa injections and knee injections in the past which have not improved her pain. The patient did previously have injections to the low back with [facility] about 15 years ago.      Of note, the patient has a pacemaker and defibrillator which prevents her from getting a MRI. The patient does also have a history of gastric bypass and is not able to take oral anti-inflammatories.     Pain or symptoms are located in the low back.   They radiate to the lateral right and occasionally left lower extremities.  They state symptoms have been present for 20 years.  Pain is rated at 8 out of 10.  Pain is described as throbbing and overall is unchanged.  She denies any alleviating factors.  Symptoms are worse with standing for long periods.  Symptoms are not affecting their quality of life.    Claudicatory or Myelopathic symptoms are not present.     They deny numbness, tingling, weakness, bowel or bladder incontinence, saddle anesthesia, fevers, chills, unintentional weight loss, night pain, hand clumsiness, difficulty with buttons, balance issues, or night sweats except as noted above.    PRIOR TREATMENT HAS INCLUDED:   []   No previous treatment   []   Ice/Heat   [x]   Activity Modification   []   NSAID   []   Gabapentin  []   Muscle relaxer  []   Oral steroids  []   Home exercise regimen  []   Physical therapy   []   Cortisone/epidural injection   []   Bracing  []   Pain medication   []   Acupuncture  []   Chiropractic care    Injections History:  None.     Spine Surgical History:  None.    PAST MEDICAL HISTORY:   Past Medical History:   Diagnosis Date    Anemia     Arthritis     Cancer (HCC/RAF)     breast    DVT (deep venous thrombosis) (HCC/RAF)     GERD (gastroesophageal reflux disease)     Hypertension     Pacemaker 2008         PAST SURGICAL HISTORY:   Past Surgical History:   Procedure Laterality Date    BREAST LUMPECTOMY Right     CHOLECYSTECTOMY  2000    foot realignment Left 2014    GASTRIC BYPASS  2000    HYSTERECTOMY  2010    REDUCTION MAMMAPLASTY  2010         CURRENT MEDICATIONS:      Current Outpatient Medications:     flecainide 100  mg tablet, Take 1 tablet (100 mg total) by mouth two (2) times daily., Disp: , Rfl:     Levalbuterol HCl (XOPENEX IN), Inhale as needed for., Disp: , Rfl:     levocetirizine 5 mg tablet, Take 1 tablet (5 mg total) by mouth daily., Disp: , Rfl:     losartan 25 mg tablet, Take 1 tablet (25 mg total) by mouth daily., Disp: , Rfl:     metoprolol tartrate 50 mg tablet, Take 1 tablet (50 mg total) by mouth two (2) times daily., Disp: , Rfl:     omeprazole 20 mg DR capsule, Take 1 capsule (20 mg total) by mouth daily., Disp: , Rfl:     triamterene-hydroCHLOROthiazide (DYAZIDE) 37.5-25 mg capsule, Take 1 capsule by mouth daily., Disp: , Rfl:     doxycycline 100 mg capsule, Take 1 capsule (100 mg total) by mouth two (2) times daily. (Patient not taking: Reported on 08/02/2022.), Disp: 14 capsule, Rfl: 0    montelukast 10 mg tablet, Take 10 mg by mouth daily. (Patient not taking: Reported on 08/02/2022.), Disp: , Rfl:     oxyCODONE-acetaminophen 5-325 mg tablet, Take 1 tablet by mouth every six (6) hours as needed for pain (Patient not taking: Reported on 08/02/2022.), Disp: 10 tablet, Rfl: 0      ALLERGIES:    Allergies   Allergen Reactions    Bacitracin-Polymyxin B     Allergies Imported From [facility]      Povidone-Iodine, Neomycin/Polymyxin B GU    Povidone Iodine     Iodine Rash       SOCIAL HISTORY:    Social History     Socioeconomic History    Marital status: Legally Separated   Tobacco Use    Smoking status: Former    Smokeless tobacco: Never        REVIEW OF SYSTEMS: The review of systems as documented today in the medical record is remarkable for the positive orthopedic problems discussed above and is available for review in the patient's chart.    PHYSICAL EXAM:    There were no vitals filed for this visit.  Body mass index is 37.94 kg/m? (pended).  GENERAL: Well-developed, well-nourished, appears stated age, no apparent distress    PULMONARY: Nonlabored respirations.      CARDIAC: Palpable radial pulses with regular rate.     SKIN: No previous spine incisions are present.     GAIT: Narrow-based, reciprocal gait. Normal heel, toe, and tandem walk.    LUMBAR SPINE EXAMINATION:    Gait and Station  The patient arises from seated to standing without difficulty    Range Of Motion     Pain with Flexion: Not performed  Pain with Extension: Not performed  Pain with Rotation: Not performed    Motor Strength   Right   Left  Hip Flexors     5   5  Quadriceps     5   5  Tibialis Anterior    5   5  Extensor Hallucis Longus   5   5  Ankle Plantarflexors     5   5      Sensation  Light touch sensation is intact in both lower extremities.     Reflexes    Right  Left  Patellar      2  2  Achilles      2  2    Tenderness   Lumbosacral midline  None   Paraspinal muscles  None   Greater Trochanteric Bursa None   SI Joints    None      Long Track Signs  Clonus    Negative   Babinski    Negative    SPECIAL TESTING:  Hip Exam: Not Performed  Knee Exam: Not performed  Straight Leg Raise: Negative bilaterally.       IMAGING & RADIOGRAPHS:  X-RAYS: Taken today at our [facility] office and reviewed by me show: 5 Views of Lumbar Spine: There are five lumbar vertebrae. There is normal lumbar lordosis. Coronal alignment is with slight curvature. Vertebral body heights are within normal limits. Disc heights are not maintained with multilevel collapse and narrowing. There is no evidence of spondylolysis. There is possible spondylolisthesis at L4-5.     LUMBAR SPINE MRI: The patient is not able to complete a MRI secondary to her pacemaker and defibrillator.     LUMBAR SPINE CT SCAN: No recent or updated CT scan of the lumbar spine has been performed to date.    Diagnosis:    ICD-10-CM    1. Low back pain, unspecified back pain laterality, unspecified chronicity, unspecified whether sciatica present  M54.50 XR lumbar spine (5 views)     CT lumbar spine w contrast post myelogram (external referral)      2. Lumbar spondylosis  M47.816 CT lumbar spine w contrast post myelogram (external referral)      3. Lumbar radiculopathy  M54.16 CT lumbar spine w contrast post myelogram (external referral)      4. Degenerative disc disease, lumbar  M51.36 CT lumbar spine w contrast post myelogram (external referral)           MEDICAL DECISION MAKING & PLAN:  I had an extensive discussion with the patient regarding their symptoms, physical exam findings, imaging findings, and the above-mentioned diagnoses. We discussed further diagnostic testing if required and multiple treatment options including non-surgical and surgical treatment options. Extensive patient specific education and counseling was provided regarding the above mentioned diagnoses.     The patient has been experiencing low back pain radiating down the right lateral and occasionally left lateral lower extremity. Her plain radiographs show multilevel spondylosis which may be contributing to her subjective complaints. It was discussed that the patient may respond favorably to non-surgical care, which entails the use of oral anti-inflammatory medications, non-narcotic analgesics, neuromodulator medications, and an exercise program emphasizing core strength and postural stabilization, preferably supervised by a physical therapist initially. The patient declined any prescriptions for medications today.     The patient is unable to complete an MRI secondary to her pacemaker and defibrillator. As such, We will obtain a CT myelogram of the lumbar spine to assess neurologic compression.    Grounds and basis for the CT scan come on the following failed conservative treatment measures: rest, activity modification and observation, exhaustion and failure of pharmacologic treatment, persistence in frequency and severity of radicular complaints, and the last MRI performed over 6 months ago.    It was agreed proceed with:     []  Physical therapy via a spine neutral approach with modalities.   []  Home exercise program and activity modification including low-impact activities and core strengthening.  []  NSAIDS if there are no GI or kidney issues.   []  Neuromodulator medications- a prescription for Gabapentin 300 mg TID was provided to the patient.   []  Muscle relaxer - Flexeril   []  Pain medications including Tramadol or narcotics. A signed opoid treatment agreement was obtained.    []   Oral steroid dose pack.   []  Topical creams, ointments, and medications.   []  Weight loss.   []  Smoking cessation.   []  Complementary and alternative treatment options such as acupuncture and/or chiropractic treatments and/or experimental treatments such as stem cell injections.  []  Diagnostic and therapeutic lumbar transforaminal epidural steroid injection at   []  Diagnostic and therapeutic cervical/lumbar epidural steroid injection at   []  Diagnostic and therapeutic cervical selective nerve root steroid injection at   []  Diagnostic and therapeutic injection at   []  Pain management referral for evaluation and treatment.   []  MRI of   to evaluate for neurologic compression.   [x]  CT myelogram of the lumbar spine  to assess neurologic compression.   []  Electrodiagnostic testing (EMG/NCS).    []  Consultation referral to   []  Work restrictions including  []   Bracing as needed.  []  Surgical treatment with . Our appropriate preoperative protocol will be initiated.     All questions were answered to the patient?s satisfaction.      FOLLOW UP: The patient will follow up after completion of the CT scan for review of those results.  At that visit we will get the following xrays: None.   The patient was referred by Dr. Leane Call and today's clinic note will be sent to this provider.    Lenord Fellers, MD  Orthopedic Spine Surgery  [facility]

## 2022-08-18 ENCOUNTER — Ambulatory Visit: Payer: BLUE CROSS/BLUE SHIELD

## 2022-08-18 DIAGNOSIS — M5136 Other intervertebral disc degeneration, lumbar region: Secondary | ICD-10-CM

## 2022-08-18 DIAGNOSIS — M47816 Spondylosis without myelopathy or radiculopathy, lumbar region: Secondary | ICD-10-CM

## 2022-08-18 DIAGNOSIS — M545 Low back pain, unspecified back pain laterality, unspecified chronicity, unspecified whether sciatica present: Secondary | ICD-10-CM

## 2022-08-18 DIAGNOSIS — M5416 Radiculopathy, lumbar region: Secondary | ICD-10-CM

## 2022-08-18 NOTE — H&P
Review of Systems   Constitutional:  Positive for malaise/fatigue.   Eyes:  Positive for blurred vision and double vision.        Wear glasses/contact lenses   Respiratory:  Positive for shortness of breath.    Cardiovascular:  Positive for palpitations.   Genitourinary:  Positive for frequency.        Incontinence of dribbling   Musculoskeletal:  Positive for back pain, joint pain and myalgias.        Joint stiffness  Weakness of muscles or joints  Cold extremities  Difficulty in walking   Neurological:  Positive for tingling.   Endo/Heme/Allergies:  Bruises/bleeds easily.        Excessive thirst   Heat or cold intolerance  Skin becoming drier  Anemia   All other systems reviewed and are negative.

## 2022-08-31 ENCOUNTER — Ambulatory Visit: Payer: BLUE CROSS/BLUE SHIELD | Attending: Medical

## 2022-08-31 NOTE — Progress Notes
Date: 08/31/2022  Name: Adrienne Love   MRN#: [id number]   DOB: 1970/07/27   Age: 52 y.o.  ___________________________________________________________________________________________________________________________________    Dr. Leane Call, DO  Thurmon Fair PA-C    The patient was seen at the [facility] office.    REASON FOR VISIT: Right knee    INTERIM HISTORY: The patient is a 52 y.o. female that comes to our office for orthopaedic evaluation of her ongoing symptoms.  The patient reports that her right knee feels similar compared to the previous visit.  Zilretta injection was denied by her insurance.  The pain is located laterally about the knee without radiation. The pain is aggravated by activity and improves with rest. The patient has difficulties sitting, standing, and walking for long periods of time. The pain is interfering with activities of daily living. The patient affirms associated clicking and popping of the knee and affirms instability with walking.     The patient?s symptoms have worsened and were not relieved with conservative treatments as noted below:    PRIOR TREATMENT HAS INCLUDED:   []   No previous treatment   [x]   Ice/Heat   [x]   NSAID   [x]   Physical therapy   [x]   Cortisone injection- 2 months of relief with Dr. Jacqlyn Krauss   [x]   Viscous-supplementation injections- 6 months of relief, with Dr. Jacqlyn Krauss 2 months ago.  []   Bracing  []   Pain meds       PHYSICAL EXAM:   Constitutional: AVAYAH RAFFETY is a 52 y.o. female in no acute distress.   There were no vitals filed for this visit.   There is no height or weight on file to calculate BMI.  Psyc: The patient is alert and oriented x3 with a normal mood and affect.   HENT: AT/NC; hearing intact   Eyes: PERRLA. Extra-ocular movements are intact.   Skin: Normal temperature. There are no rashes, ulcerations, or lesions.   Lymphatic: No induration or enlargement.   Extremities: No swelling or calf tenderness. clubbing. Cyanosis.  Gait:  [x]  Antalgic []  Normal []  Trendelenburg   Valgus thrust, right knee worse than left.   Assistive devices: none     KNEE EXAM  Bilateral knees: The patient is grossly neurologically intact from L2-S1. 2+ deep tendon reflexes of the patellofemoral and achilles tendons. 2+ posterior tibialis and dorsalis pedis pulses. No lymphedema.  Skin has no lesions. Compartments are soft.     Right Knee Exam:   Healed arthroscopic portals  Erythema present:                  []  Yes [x]  No   Bruising or ecchymosis:          []  Yes [x]  No   Palpable masses present:      []  Yes [x]  No   [x]  No effusion present   []  Small Effusion present []  Moderate Effusion present      []  Large effusion  present  Palpation tenderness severity: []  None   [x]  Mild   []  Moderate    []  Severe  Palpable tenderness location:              []  N/A              [x]  Medial joint line              []  Lateral joint line               []  Posteromedial joint line              []   Posterolateral joint line              []  Posterior knee              []  Patellofemoral joint line              []  Diffusely               []  Quadriceps tendon              []  Patellar tendon              [x]  Pes anserine bursa              []  Patella              []  Diffusely        Range of motion is 0? extension to 115? flexion  Guarding and crepitus:           []  Yes [x]  No   Flexion contractures:              []  Yes [x]  No   The knee is unstable with 1+ laxity with Varus and Valgus stress at 0, 30, 90?Marland Kitchen     Special tests:   McMurray's test:   []  Positive [x]  Negative []  Not Tested  Reverse McMurray's test: [x]  Positive []  Negative []  Not Tested  Apley's test:   []  Positive [x]  Negative []  Not Tested  Lachman's test:  []  Positive [x]  Negative []  Not Tested  Anterior drawer test:  []  Positive [x]  Negative []  Not Tested  Posterior drawer test:  []  Positive [x]  Negative []  Not Tested  Patellar apprehension test: []  Positive [x]  Negative []  Not Tested  Patellar grind test:  []  Positive [x]  Negative []  Not Tested  Muscle Strength of the knee is 5/5. Patellar tracking is normal    Left Knee Exam:   No surgical incisions or scars are noted.  Recurvatum deformity at least 5 degrees  Erythema present:                  []  Yes [x]  No   Bruising or ecchymosis:          []  Yes [x]  No   Palpable masses present:      []  Yes [x]  No   [x]  No effusion present   []  Small Effusion present []  Moderate Effusion present      []  Large effusion  present  Palpation tenderness severity: []  None   [x]  Mild   []  Moderate    []  Severe  Palpable tenderness location:              []  N/A              [x]  Medial joint line              []  Lateral joint line               []  Posteromedial joint line              []  Posterolateral joint line              []  Posterior knee              []  Patellofemoral joint line              []  Diffusely               []  Quadriceps tendon              []   Patellar tendon              []  Pes anserine bursa              []  Patella              []  Diffusely        Range of motion is 0? extension to 115? flexion  Guarding and crepitus:           []  Yes [x]  No   Flexion contractures:              []  Yes [x]  No   The knee is unstable with 2+ laxity with Varus stress at 0, 30, 90?Marland Kitchen     Special tests:   McMurray's test:   []  Positive [x]  Negative []  Not Tested  Reverse McMurray's test: []  Positive [x]  Negative []  Not Tested  Apley's test:   []  Positive [x]  Negative []  Not Tested  Lachman's test:  []  Positive [x]  Negative []  Not Tested  Anterior drawer test:  []  Positive [x]  Negative []  Not Tested  Posterior drawer test:  []  Positive [x]  Negative []  Not Tested  Patellar apprehension test: []  Positive [x]  Negative []  Not Tested  Patellar grind test:  []  Positive [x]  Negative []  Not Tested  Muscle Strength of the knee is 5/5. Patellar tracking is normal    RADIOGRAPHS: 4 views of right knee and 3 view(s) comparison of the opposite knee were reviewed by me here at [facility] today and reviewed with the patient. They show severe osteoarthritis with lateral joint space narrowing, subchondral sclerosis, and periarticular spurring.     IMPRESSION:   Right knee severe osteoarthritis  Pacemaker/Defibrillator- Dr. Russ Halo- non MRI compatible   Ventricular tachycardia  Osteoporosis/Osteopenia   Suspicion of bilateral lumbosacral radiculopathy, L4-L5 radiculopath  Suspicion of neuropathy  Gastric bypass  Bilateral hip bursitis    PLAN: We had a lengthy discussion with the patient today regarding her pain. I discussed with the patient their physical exam findings, radiographs and imaging, above diagnoses, and current treatment options along with their risks and benefits.     Again, her subjective complaints and objective findings are consistent with severe lateral compartmental osteoarthritis of the right knee.  She wants to hold off on knee replacement surgery.  The previous visco-supplementation injection helped her tremendously therefore we are requesting authorization for Synvisc injection of the right knee.  She would like to proceed with a cortisone injection of her right knee today.  If she fails this then we will proceed with Kenalog injection and then after that could consider requesting Zilretta injection.     The patient's x-rays demonstrate poor bone quality, likely osteopenic or osteoporotic in nature. As such I recommended Calcium 1200 mg daily, Vitamin D 5,000 IU daily and a DEXA Scan ordered by myself.      NEXT APPOINTMENT:      Large Joint/Bursa Drain/Inject: R knee    Date/Time: 08/31/2022 4:00 PM    Performed by: Thurmon Fair D., PA-C  Authorized by: Thurmon Fair D., PA-C    Consent Given by:  Patient  Site marked: the procedure site was marked    Timeout: prior to procedure the correct patient, procedure, and site was verified    Indications:  Pain and diagnostic evaluation  Location:  Knee  Site:  R knee  Prep: patient was prepped and draped in usual sterile fashion    Approach:  Anterolateral  Needle Size:  22 G (1.5 inch needle)  Medications:  2 mL lidocaine 1%; 2 mL BUPivacaine 0.25%; 80 mg methylPREDNISolone ACETATE 40 mg/mL  Patient tolerance:  Patient tolerated the procedure well with no immediate complications   PROCEDURE:  Knee joint injection    Informed Consent: The risks of the knee joint injection was first reviewed with the patient, including infection, bleeding, and nerve damage. The benefits (reduced or eliminated pain, improved stability), adverse effects (soreness, stiffness, temporarily increased pain), and post-injection expectations also were explained.  The patient orally consented to the procedure and understood the risks, benefits, and alternatives.    Ethyl chloride was used to locally anesthetize the injection site of the knee. The knee was then prepared in the usual sterile fashion using povidone-iodine and alcohol. The listed needle above with a 10cc syringe was used with the medication listed above to perform an arthrocentesis of the joint which revealed no frank blood, and the medication was then administered intra-articularly easily without resistance.  The site was then cleaned and covered with a bandage after hemostasis was met. The procedure was tolerated well without any complications. The patient was discharged in stable condition with post-injection instructions to avoid standing and walking for very long extended periods of time and high intensity activities.         Thank you for allowing me to participate in Shivangi Lutz Reddix?s care. Please do not hesitate to contact me if you have any questions or concerns.    The patient was examined and evaluated by Thurmon Fair PA-C for Dr. [name], D.O.      Olegario Shearer D. Otilio Miu, PA-C  Physician assistant for Dr. [name], D.O.  [facility]  09/09/2022 numbness and tingling in both feet, she was recommended to follow up with Dr. [name] or Dr. Kathrene Alu. She understands she has concomitant issues, including back issues that can contribute to instability of the right knee. She was told her pacemaker was non-MRI compatible. She continues to have bilateral foot numbness and tingling 4 years, with right sided weakness than left.     Her left knee complaints are tolerable at this time.     Patient educated on the importance of weight loss through proper, improved nutrition and increased cardiovascular exercise 30-45 minutes for 5-6 days per week. Patient educated on low impact exercises, such as elliptical machine, stationary bike, and aquatic therapy/pool therapy.    The patient's x-rays demonstrate poor bone quality, likely osteopenic or osteoporotic in nature. As such I recommended Calcium 1200 mg daily, Vitamin D 5,000 IU daily and a DEXA Scan ordered by myself.     Lastly, regarding her bilateral hip bursitis, we will obtain new radiographs at the next appointment. She was given a handout for PRP injection as she has attempted multiple cortisone bursae injections with varying relief.      NEXT APPOINTMENT:  Follow up after authorization for a right knee Zilretta injection, DEXA scan results and obtain bilateral hip radiographs at the next visit.     Thank you for allowing me to participate in Stepfanie Yott Lyerly?s care. Please do not hesitate to contact me if you have any questions or concerns.         Leane Call, D.O.  Adult Reconstruction Specialist  ORTHOPEDIC SURGERY  08/31/2022

## 2022-09-07 NOTE — Progress Notes
Date: 09/13/2022  Name: Adrienne Love   MRN#: [id number]   DOB: [birth year]   Age: 52 y.o.  ___________________________________________________________________________________________________________________________________    Dr. Leane Call, DO    The patient was seen at the [facility] office.     REASON FOR VISIT: Bilateral hips    INTERIM HISTORY: Adrienne Love presents today for orthopedic evaluation of her persistent bilateral hip pain. her pain is dull in quality and is located over the {Blank multiple:19196::''anterior hip'',''lateral hip'',''posterior hip''} with radiation diffusely. It is a {NUMBERS; 1-10 (OUT OF 10):26129} in severity and {Blank single:19197::''is constant and everyday'',''intermittent''} in frequency. The hip pain is exacerbated with weight-bearing activity and is interfering with her activities of daily living. There is stiffness and pain present at night associated with the hip. Patient has failed conservative treatments as noted below and pain has been worsening.     Prior Treatment Has Included:   []   No previous treatment   []   Ice/Heat   []   NSAID   []   Physical therapy   []   Cortisone injection   []   Pain meds     Past Medical History:   Past Medical History:   Diagnosis Date    Anemia     Arthritis     Asthma     Cancer (HCC/RAF)     breast    DVT (deep venous thrombosis) (HCC/RAF)     GERD (gastroesophageal reflux disease)     Heart disease     Hypertension     Pacemaker 2008       Past Surgical History:   Past Surgical History:   Procedure Laterality Date    BREAST LUMPECTOMY Right     CARDIAC PACEMAKER PLACEMENT      CHOLECYSTECTOMY  2000    foot realignment Left 2014    GASTRIC BYPASS  2000    HYSTERECTOMY  2010    REDUCTION MAMMAPLASTY  2010       Medications:   Current Outpatient Medications   Medication Sig    flecainide 100 mg tablet Take 1 tablet (100 mg total) by mouth two (2) times daily.    Levalbuterol HCl (XOPENEX IN) Inhale as needed for.    levocetirizine 5 mg tablet Take 1 tablet (5 mg total) by mouth daily.    losartan 25 mg tablet Take 1 tablet (25 mg total) by mouth daily.    Metoprolol Succinate 50 MG CS24 50 mg.    metoprolol tartrate 50 mg tablet Take 1 tablet (50 mg total) by mouth two (2) times daily.    molnupiravir (LAGEVRIO) 200 mg capsule TAKE 4 CAPSULES BY MOUTH TWICE A DAY FOR 5 DAYS    omeprazole 20 mg DR capsule Take 1 capsule (20 mg total) by mouth daily.    semaglutide (WEGOVY) 0.25 mg/0.5 mL injection pen 0.5 mLs (0.25 mg total).    triamterene-hydroCHLOROthiazide (DYAZIDE) 37.5-25 mg capsule Take 1 capsule by mouth daily.     No current facility-administered medications for this visit.       Allergies:   Allergies   Allergen Reactions    Morphine      Other Reaction(s): Abdominal pain    Patient Reported Allergy via Notable    Bacitracin-Polymyxin B     Allergies Imported From [facility]      Povidone-Iodine, Neomycin/Polymyxin B GU    Povidone Iodine     Iodine Rash       Social history:   Social History     Tobacco  Use    Smoking status: Former    Smokeless tobacco: Never       Family history: No family history on file.    REVIEW OF SYSTEMS: The 14-point review of systems as documented today in the medical record is remarkable for the positive orthopedic problems discussed above and their relevance was considered with respect to Constitutional, ENT, Cardiovascular, GU, Skin, Neurologic, Endocrine, Hematologic, Psychiatric, Gastrointestinal, Respiratory, Eyes and Allergic/Immunologic systems.     PHYSICAL EXAM:   There were no vitals filed for this visit. There is no height or weight on file to calculate BMI.  Constitutional: Adrienne Love is a 52 y.o. female in no acute distress.   Psyc: The patient is alert and oriented x3 with a normal mood and affect.   HENT: AT/NC; hearing intact   Eyes: PERRLA. Extra-ocular movements are intact.   Skin: Normal temperature. There are no rashes, ulcerations, or lesions.   Lymphatic: No induration or enlargement. Extremities: No swelling or calf tenderness. clubbing. cyanosis.   Gait:  []   Antalgic [x]    Normal []   Trendelenburg   Assistive devices: {Assistive Devices HYQ:65784}    HIP EXAM   Right Hip Exam:   {Blank single:19197::''A well healed midline incision is noted.'',''Well healed surgical incisions are noted.'',''Healed mini posterior incision is noted.'',''Surgical scars are noted.'',''No surgical incisions or scars are noted.''}  Balance disturbance: []   Yes [x]  No  Snapping or clicking: []   Yes [x]  No  Erythema present: []   Yes [x]  No   Ecchymosis present []  Yes [x]  No  Swelling present: []  Yes [x]  No  Palpable masses: []  Yes [x]  No   Tenderness to palpation: Palpation is negative unless marked below.   []  Iliac crest  []  Greater trochanter  []  Ischium  []  SI joint  []  Pubic Symphysis  []  Log roll  ROM(degrees/pain): There is no pain with ROM unless marked below.  Flexion  110?   []  with pain laterally. []  with pain in the groin.  Extension 0?    []  with pain laterally. []  with pain in the groin.  Abduction 30?  []  with pain laterally. []  with pain in the groin.  Adduction 20?  []  with pain laterally. []  with pain in the groin.  Internal rotation {Blank single:19197::''15'',''25''}?  []  with pain laterally. []  with pain in the groin.  External rotation {Blank single:19197::''35'',''45''}? []  with pain laterally. []  with pain in the groin.  Motor Strength:   Hip flexion 5/5  Hip extension 5/5   Leg abductors (Superior gluteal (L4-S1)) 5/5  Knee extension (Femoral (L2-L4)) 5/5  Knee Flexion (Sciatic (L3-L4))  5/5  Neurovascular:   Sensation is normal throughout  Upper anterior thigh (L2) Normal  Lateral thigh (L2) Normal  Mid anterior thigh (L3) Normal  Knee, inside of leg (L4) Normal  Dorsum of foot (L5) Normal  Lateral border of foot (S1) Normal  Straight leg test:   []  Positive [x]  Negative []  Not Tested  Vascular   DP: Palpable  PT: Palpable  Special Tests:   Impingement (FADDIR): []  Positive [x]  Negative []  Not Tested  Trendelenburg sign:  []  Positive [x]  Negative []  Not Tested  Lower back:   There is no tenderness, deformity or injury. All special test are normal.     Left Hip Exam:   {Blank single:19197::''A well healed midline incision is noted.'',''Well healed surgical incisions are noted.'',''Healed mini posterior incision is noted.'',''Surgical scars are noted.'',''No surgical incisions or scars are noted.''}  Balance disturbance: []   Yes [x]  No  Snapping or clicking: []  Yes [x]  No  Erythema present: []  Yes [x]  No   Ecchymosis present: []  Yes [x]  No  Swelling present: []  Yes [x]  No  Palpable masses: []  Yes [x]  No   Tenderness to palpation: Palpation is negative unless marked below.   []  Iliac crest  []  Greater trochanter  []  Ischium  []  SI joint  []  Pubic Symphysis  []  Log roll  ROM(degrees/pain): There is no pain with ROM unless marked below.  Flexion  110?   []  with pain laterally. []  with pain in the groin.  Extension 0?    []  with pain laterally. []  with pain in the groin.  Abduction 30?  []  with pain laterally. []  with pain in the groin.  Adduction 20?  []  with pain laterally. []  with pain in the groin.  Internal rotation {Blank single:19197::''15'',''25''}?  []  with pain laterally. []  with pain in the groin.  External rotation {Blank single:19197::''35'',''45''}? []  with pain laterally. []  with pain in the groin.  Motor Strength:   Hip flexion 5/5  Hip extension 5/5   Leg abductors (Superior gluteal (L4-S1)) 5/5  Knee extension (Femoral (L2-L4)) 5/5  Knee Flexion (Sciatic (L3-L4))  5/5  Neurovascular:   Sensation is normal throughout  Upper anterior thigh (L2) Normal  Lateral thigh (L2) Normal  Mid anterior thigh (L3) Normal  Knee, inside of leg (L4) Normal  Dorsum of foot (L5) Normal  Lateral border of foot (S1) Normal  Straight leg test:   []  Positive [x]  Negative []  Not Tested  Vascular   DP: Palpable  PT: Palpable  Special Tests:   Impingement (FADDIR): []  Positive [x]  Negative []  Not Tested  Trendelenburg sign:  []  Positive [x]  Negative []  Not Tested     Lumbar Examination: Gross evaluation of the patient?s back reveals no abnormality.   TTP: There is no tenderness to palpation over the axial spine.  There is no lumbar paravertebral muscle tenderness.  There is no buttock and piriformis area tenderness.   ROM: The patient has full range of motion of the lumbar spine. Flexion nor extension appear to exacerbate the pain.  The patient's strength is equal and symmetric about bilateral lower extremity.    KNEE EXAM  Bilateral knees: The patient is grossly neurologically intact from L2-S1. 2+ deep tendon reflexes of the patellofemoral and achilles tendons. 2+ posterior tibialis and dorsalis pedis pulses. No lymphedema.  Skin has no lesions. Compartments are soft.     Right Knee Exam:   {Blank single:19197::''A well healed midline incision is noted.'',''Well healed surgical incisions are noted.'',''Healed mini posterior incision is noted.'',''Surgical scars are noted.'',''No surgical incisions or scars are noted.''}  Erythema present:                  []  Yes [x]  No   Bruising or ecchymosis:          []  Yes [x]  No   Palpable masses present:      []  Yes [x]  No   [x]  No effusion present   []  Small Effusion present []  Moderate Effusion present      []  Large effusion  present  Palpation tenderness severity: []  None   [x]  Mild   []  Moderate    []  Severe  Palpable tenderness location:              []  N/A              [x]  Medial joint line              []   Lateral joint line               []  Posteromedial joint line              []  Posterolateral joint line              []  Posterior knee              []  Patellofemoral joint line              []  Diffusely               []  Quadriceps tendon              []  Patellar tendon              []  Pes anserine bursa              []  Patella              []  Diffusely        Range of motion is 0? extension to 115? flexion  Guarding and crepitus:           []  Yes [x]  No   Flexion contractures:              []  Yes [x]  No   The knee is {Blank single:20442::''stable'',''unstable''} with {Blank single:20442::''no'',''1+'',''2+'',''3+''} laxity with {Blank single:20442::''Varus and Valgus'',''Varus'',''Valgus''} stress at 0, 30, 90?Marland Kitchen   Special tests:   McMurray's test:   []  Positive [x]  Negative []  Not Tested  Reverse McMurray's test: []  Positive [x]  Negative []  Not Tested  Apley's test:   []  Positive [x]  Negative []  Not Tested  Lachman's test:  []  Positive [x]  Negative []  Not Tested  Anterior drawer test:  []  Positive [x]  Negative []  Not Tested  Posterior drawer test:  []  Positive [x]  Negative []  Not Tested  Patellar apprehension test: []  Positive [x]  Negative []  Not Tested  Patellar grind test:  []  Positive [x]  Negative []  Not Tested  Muscle Strength of the knee is 5/5. Patellar tracking is normal    Left Knee Exam:   {Blank single:19197::''A well healed midline incision is noted.'',''Well healed surgical incisions are noted.'',''Healed mini posterior incision is noted.'',''Surgical scars are noted.'',''No surgical incisions or scars are noted.''}  Erythema present:                  []  Yes [x]  No   Bruising or ecchymosis:          []  Yes [x]  No   Palpable masses present:      []  Yes [x]  No   [x]  No effusion present   []  Small Effusion present []  Moderate Effusion present      []  Large effusion  present  Palpation tenderness severity: []  None   [x]  Mild   []  Moderate    []  Severe  Palpable tenderness location:              []  N/A              [x]  Medial joint line              []  Lateral joint line               []  Posteromedial joint line              []  Posterolateral joint line              []  Posterior knee              []   Patellofemoral joint line              []  Diffusely               []  Quadriceps tendon              []  Patellar tendon              []  Pes anserine bursa              []  Patella              []  Diffusely        Range of motion is 0? extension to 115? flexion  Guarding and crepitus:           []  Yes [x]  No   Flexion contractures: []  Yes [x]  No   The knee is {Blank single:20442::''stable'',''unstable''} with {Blank single:20442::''no'',''1+'',''2+'',''3+''} laxity with {Blank single:20442::''Varus and Valgus'',''Varus'',''Valgus''} stress at 0, 30, 90?Marland Kitchen   Special tests:   McMurray's test:   []  Positive [x]  Negative []  Not Tested  Reverse McMurray's test: []  Positive [x]  Negative []  Not Tested  Apley's test:   []  Positive [x]  Negative []  Not Tested  Lachman's test:  []  Positive [x]  Negative []  Not Tested  Anterior drawer test:  []  Positive [x]  Negative []  Not Tested  Posterior drawer test:  []  Positive [x]  Negative []  Not Tested  Patellar apprehension test: []  Positive [x]  Negative []  Not Tested  Patellar grind test:  []  Positive [x]  Negative []  Not Tested  Muscle Strength of the knee is 5/5. Patellar tracking is normal.    Radiographs: Ap pelvis and two views of the hip were ordered, taken, and interpreted by me from an orthopedic standpoint here today. They demonstrate osteoarthritis with approximately percent joint space narrowing, osteophyte formation, and subchondral sclerosis.     Impression: Bilateral hip ***    Plan: We had a lengthy discussion with the patient today regarding her pain. At this point I would like to start her on Mobic 15 mg p.o. daily with food and to stop all other anti-inflammatories. I would also like to inject the right hip today with a corticosteroid to try to provide  maximal immediate pain relief. Hopefully this provides lasting relief.     Patient educated on the importance of weight loss through proper, improved nutrition and increased cardiovascular exercise 30-45 minutes for 5-6 days per week. Patient educated on low impact exercises, such as elliptical machine, stationary bike, and aquatic therapy/pool therapy.       Follow up: The patient will follow up in ***.     Thank you for allowing me to participate in Adrienne Love?s care. Please do not hesitate to contact me if you have any questions or concerns. Leane Call, D.O.  Adult Reconstruction Specialist  ORTHOPEDIC SURGERY  09/07/2022

## 2022-09-08 ENCOUNTER — Ambulatory Visit: Payer: BLUE CROSS/BLUE SHIELD

## 2022-09-09 DIAGNOSIS — M1711 Unilateral primary osteoarthritis, right knee: Secondary | ICD-10-CM

## 2022-09-09 DIAGNOSIS — M25561 Pain in right knee: Secondary | ICD-10-CM

## 2022-09-09 MED ADMIN — LIDOCAINE HCL 1 % IJ SOLN: INTRA_ARTICULAR | @ 14:00:00 | Stop: 2022-08-31

## 2022-09-09 MED ADMIN — METHYLPREDNISOLONE ACETATE 40 MG/ML IJ SUSP: INTRA_ARTICULAR | @ 14:00:00 | Stop: 2022-08-31

## 2022-09-09 MED ADMIN — BUPIVACAINE HCL 0.25 % IJ SOLN: INTRA_ARTICULAR | @ 14:00:00 | Stop: 2022-08-31

## 2022-09-13 ENCOUNTER — Ambulatory Visit: Payer: BLUE CROSS/BLUE SHIELD

## 2022-10-03 ENCOUNTER — Ambulatory Visit: Payer: BLUE CROSS/BLUE SHIELD | Attending: Medical

## 2022-10-03 DIAGNOSIS — M1711 Unilateral primary osteoarthritis, right knee: Secondary | ICD-10-CM

## 2022-10-03 DIAGNOSIS — M25561 Pain in right knee: Secondary | ICD-10-CM

## 2022-10-03 DIAGNOSIS — Z91199 No-show for appointment: Secondary | ICD-10-CM

## 2022-10-03 NOTE — Progress Notes
Date: 10/03/2022  Name: Adrienne Love   MRN#: [id number]   DOB: 03-16-1971   Age: 52 y.o.  ___________________________________________________________________________________________________________________________________    Dr. Leane Call, DO  Thurmon Fair PA-C    The patient was seen at the [facility] office.    REASON FOR VISIT: Right knee    INTERIM HISTORY: The patient is a 52 y.o. female that comes to our office for orthopaedic evaluation of her ongoing symptoms.  The patient reports that her right knee pain is worsening, making her quite miserable.  The cortisone injection improved her knee pain however it is still interfering with her quality of life.  Zilretta injection was denied by her insurance.  The pain is located laterally about the knee without radiation. The pain is aggravated by activity and improves with rest. The patient has difficulties sitting, standing, and walking for long periods of time. The pain is interfering with activities of daily living. The patient affirms associated clicking and popping of the knee and affirms instability with walking.     The patient?s symptoms have worsened and were not relieved with conservative treatments as noted below:    PRIOR TREATMENT HAS INCLUDED:   []   No previous treatment   [x]   Ice/Heat   [x]   NSAID   [x]   Physical therapy   [x]   Cortisone injection- 2 months of relief with Dr. Jacqlyn Krauss   [x]   Viscous-supplementation injections- 6 months of relief, with Dr. Jacqlyn Krauss 2 months ago.  []   Bracing  []   Pain meds       PHYSICAL EXAM:   Constitutional: Adrienne Love is a 51 y.o. female in no acute distress.   There were no vitals filed for this visit.   There is no height or weight on file to calculate BMI.  Psyc: The patient is alert and oriented x3 with a normal mood and affect.   HENT: AT/NC; hearing intact   Eyes: PERRLA. Extra-ocular movements are intact.   Skin: Normal temperature. There are no rashes, ulcerations, or lesions.   Lymphatic: No induration or enlargement.   Extremities: No swelling or calf tenderness. clubbing. Cyanosis.  Gait:  [x]  Antalgic []  Normal []  Trendelenburg   Valgus thrust, right knee worse than left.   Assistive devices: none     KNEE EXAM  Bilateral knees: The patient is grossly neurologically intact from L2-S1. 2+ deep tendon reflexes of the patellofemoral and achilles tendons. 2+ posterior tibialis and dorsalis pedis pulses. No lymphedema.  Skin has no lesions. Compartments are soft.     Right Knee Exam:   Healed arthroscopic portals  Erythema present:                  []  Yes [x]  No   Bruising or ecchymosis:          []  Yes [x]  No   Palpable masses present:      []  Yes [x]  No   [x]  No effusion present   []  Small Effusion present []  Moderate Effusion present      []  Large effusion  present  Palpation tenderness severity: []  None   [x]  Mild   []  Moderate    []  Severe  Palpable tenderness location:              []  N/A              [x]  Medial joint line              []  Lateral joint line               []   Posteromedial joint line              []  Posterolateral joint line              []  Posterior knee              []  Patellofemoral joint line              []  Diffusely               []  Quadriceps tendon              []  Patellar tendon              [x]  Pes anserine bursa              []  Patella              []  Diffusely        Range of motion is 0? extension to 115? flexion  Guarding and crepitus:           []  Yes [x]  No   Flexion contractures:              []  Yes [x]  No   The knee is unstable with 1+ laxity with Varus and Valgus stress at 0, 30, 90?Marland Kitchen     Special tests:   McMurray's test:   []  Positive [x]  Negative []  Not Tested  Reverse McMurray's test: [x]  Positive []  Negative []  Not Tested  Apley's test:   []  Positive [x]  Negative []  Not Tested  Lachman's test:  []  Positive [x]  Negative []  Not Tested  Anterior drawer test:  []  Positive [x]  Negative []  Not Tested  Posterior drawer test:  []  Positive [x]  Negative []  Not Tested  Patellar apprehension test: []  Positive [x]  Negative []  Not Tested  Patellar grind test:  []  Positive [x]  Negative []  Not Tested  Muscle Strength of the knee is 5/5. Patellar tracking is normal    Left Knee Exam:   No surgical incisions or scars are noted.  Recurvatum deformity at least 5 degrees  Erythema present:                  []  Yes [x]  No   Bruising or ecchymosis:          []  Yes [x]  No   Palpable masses present:      []  Yes [x]  No   [x]  No effusion present   []  Small Effusion present []  Moderate Effusion present      []  Large effusion  present  Palpation tenderness severity: []  None   [x]  Mild   []  Moderate    []  Severe  Palpable tenderness location:              []  N/A              [x]  Medial joint line              []  Lateral joint line               []  Posteromedial joint line              []  Posterolateral joint line              []  Posterior knee              []  Patellofemoral joint line              []  Diffusely               []   Quadriceps tendon              []  Patellar tendon              []  Pes anserine bursa              []  Patella              []  Diffusely        Range of motion is 0? extension to 115? flexion  Guarding and crepitus:           []  Yes [x]  No   Flexion contractures:              []  Yes [x]  No   The knee is unstable with 2+ laxity with Varus stress at 0, 30, 90?Marland Kitchen     Special tests:   McMurray's test:   []  Positive [x]  Negative []  Not Tested  Reverse McMurray's test: []  Positive [x]  Negative []  Not Tested  Apley's test:   []  Positive [x]  Negative []  Not Tested  Lachman's test:  []  Positive [x]  Negative []  Not Tested  Anterior drawer test:  []  Positive [x]  Negative []  Not Tested  Posterior drawer test:  []  Positive [x]  Negative []  Not Tested  Patellar apprehension test: []  Positive [x]  Negative []  Not Tested  Patellar grind test:  []  Positive [x]  Negative []  Not Tested  Muscle Strength of the knee is 5/5. Patellar tracking is normal    RADIOGRAPHS: 4 views of right knee and 3 view(s) comparison of the opposite knee were reviewed by me here at [facility] today and reviewed with the patient. They show severe osteoarthritis with lateral joint space narrowing, subchondral sclerosis, and periarticular spurring.     IMPRESSION:   Right knee severe osteoarthritis  Pacemaker/Defibrillator- Dr. Russ Halo- non MRI compatible   Ventricular tachycardia  Osteoporosis/Osteopenia   Suspicion of bilateral lumbosacral radiculopathy, L4-L5 radiculopath  Suspicion of neuropathy  Gastric bypass  Bilateral hip bursitis    PLAN: We had a lengthy discussion with the patient today regarding her pain. I discussed with the patient their physical exam findings, radiographs and imaging, above diagnoses, and current treatment options along with their risks and benefits.     Again, her subjective complaints and objective findings are consistent with severe lateral compartmental osteoarthritis of the right knee.     The patient's x-rays demonstrate poor bone quality, her DEXA scan demonstrates osteoporosis, I recommended vitamin-D 5000 units daily and referred her to endocrinologist Dr. Trudee Kuster for further treatment management.    We discussed all conservative and surgical treatment options today including the risks and benefits.  They no longer wish to proceed with any conservative treatment. They would like to proceed with a Right total knee arthroplasty as quickly as possible. CT scan of the involved joint is ordered today.  We discussed that this is primarily a pain relieving operation and that while most patients are able to improve their function this is not guaranteed with surgery.     We stressed the need for compliance with the treatment regimen post surgery. The patient understands and has elected to proceed.  We will start the process of insurance verification and preoperative clearance from their PCP.  We will also give a letter to send to their primary treating physician. We will schedule them at their earliest convenience.     NEXT APPOINTMENT: Pre-operative appointment for Right total knee arthroplasty as an outpatient at the hospital     The patient was examined and  evaluated by Thurmon Fair PA-C for Dr. [name], D.O.      Olegario Shearer D. Otilio Miu, PA-C  Physician assistant for Dr. [name], D.O.  [facility]  10/03/2022   Thank you for allowing me to participate in Adrienne Love?s care. Please do not hesitate to contact me if you have any questions or concerns.    The patient was examined and evaluated by Thurmon Fair PA-C for Dr. [name], D.O.      Olegario Shearer D. Otilio Miu, PA-C  Physician assistant for Dr. [name], D.O.  [facility]  10/03/2022

## 2022-10-07 ENCOUNTER — Ambulatory Visit: Payer: BLUE CROSS/BLUE SHIELD

## 2022-10-10 NOTE — Progress Notes
DATE: 10/17/2022   NAME: Adrienne Love   MRN: [id number]   DOB: [birth year]   AGE: 52 y.o.     Patient was seen in our [facility] by: [name]. [name], MD    Chief Complaint:  Chief Complaint   Patient presents with    Lower Back - Results       History:   This is a  52 y.o. year old female who returns for re-evaluation of her low back pain radiating down the right lateral lower extremity currently. In the interim since her last visit, she has completed a CT myelogram for which she returns today for review of those results. The patient states she had difficulty completing the CT myelogram as her left leg went numb, her blood pressure dropped and she nearly passed out. She has continued to have low back pain with intermittent lower extremities pain.     She has been treated with trochanteric bursa injections and knee injections in the past which have not improved her pain. The patient did previously have injections to the low back with [facility] about 15 years ago.      Of note, the patient has a pacemaker and defibrillator which prevents her from getting a MRI. The patient does also have a history of gastric bypass and is not able to take oral anti-inflammatories.     They deny numbness, tingling, weakness, bowel or bladder incontinence, saddle anesthesia, fevers, chills, unintentional weight loss, night pain, hand clumsiness, difficulty with buttons, balance issues, or night sweats except as noted above.    PRIOR TREATMENT HAS INCLUDED:   []   No previous treatment   []   Ice/Heat   [x]   Activity Modification   []   NSAID   []   Gabapentin  []   Muscle relaxer  []   Oral steroids  []   Home exercise regimen  []   Physical therapy   [x]   Cortisone/epidural injection   []   Bracing  []   Pain medication   []   Acupuncture  []   Chiropractic care    Injections History:  The patient had previous injection about 15 years ago, but the injection reports are not available for review today.      Spine Surgical History:  None.    REVIEW OF SYSTEMS: The review of systems as documented today in the medical record is remarkable for the positive orthopedic problems discussed above and is available for review in the patient's chart.    PHYSICAL EXAM:    There were no vitals filed for this visit.  There is no height or weight on file to calculate BMI.  GENERAL: Well-developed, well-nourished, appears stated age, no apparent distress    PULMONARY: Nonlabored respirations.      CARDIAC: Palpable radial pulses with regular rate.     SKIN: No previous spine incisions are present.     GAIT: Narrow-based, reciprocal gait. Normal heel, toe, and tandem walk.    LUMBAR SPINE EXAMINATION:    Gait and Station  The patient arises from seated to standing without difficulty    Range Of Motion     Pain with Flexion: Not performed  Pain with Extension: Not performed  Pain with Rotation: Not performed    Motor Strength   Right   Left  Hip Flexors     5   5  Quadriceps     5   5  Tibialis Anterior    5   5  Extensor Hallucis Longus  5   5  Ankle Plantarflexors     5   5      Sensation  Light touch sensation is intact in both lower extremities.     Reflexes    Right  Left  Patellar      2  2  Achilles      2  2    Tenderness   Lumbosacral midline  None   Paraspinal muscles    None   Greater Trochanteric Bursa None   SI Joints    None      Long Track Signs  Clonus    Negative   Babinski    Negative    SPECIAL TESTING:  Hip Exam: Not Performed  Knee Exam: Not performed  Straight Leg Raise: Negative bilaterally.       IMAGING & RADIOGRAPHS:  X-RAYS: No new radiographs were obtained in office today.     LUMBAR SPINE MRI: The patient is not able to complete a MRI secondary to her pacemaker and defibrillator.     LUMBAR SPINE CT MYELOGRAM SCAN: CT myelogram of the lumbar spine was performed at [facility] on Sep 30, 2022. CT scan is reviewed and interpreted by myself from an orthopedic standpoint in office today with the patient. CT scan reveals:  Chronic mild to moderate anterior wedging deformity at the T12 level. Lumbar vertebral body heights preserved. Mild chronic Schmorl's node deformities at several levels. Mild levoconvex curvature of the lumbar spine. Moderate loss of disc height L4-L5 and L5-S1 levels and mild loss of disc height throughout the remainder of the lumbar spine.  Conus terminates at the L1-L2 level and is normal in morphology.  T12-L1 level, canal and foramen are patent.  L1-L2 level, canal and foramen are patent.  L2-L3 level, canal and foramen are patent. Mild left and mild-to-moderate right-sided facet changes.  L3-L4 level, mild facet changes and hypertrophy of ligamentum flavum. Central canal and foramen are patent.  L4-L5 level, broad-based disc bulge measuring 2 mm. Minimal facet changes. Minimal canal stenosis. Mild right foraminal narrowing due to endplate osteophyte and mild right-sided facet changes. Left foramen is patent.  L5-S1 level, central canal is patent. Mild left and mild-to-moderate right foraminal narrowing due to endplate osteophyte and facet changes. Right foraminal narrowing accentuated by scoliotic curvature.  Visualized SI joints are preserved. Paraspinal musculature is unremarkable. Partial visualization of prominent left renal cyst.    My personal interpretation of the CT myelogram reveals:  There is a previous compression fracture at T12. There is multilevel spondylosis.    Diagnosis:    ICD-10-CM    1. Lumbar spondylosis  M47.816 Referral to [facility], Physical Therapy      2. Degenerative disc disease, lumbar  M51.36 Referral to [facility], Physical Therapy      3. Degenerative scoliosis  M41.50 Referral to [facility], Physical Therapy      4. Lumbar foraminal stenosis  M48.061 Referral to [facility], Physical Therapy             MEDICAL DECISION MAKING & PLAN:  I had an extensive discussion with the patient regarding their symptoms, physical exam findings, imaging findings, and the above-mentioned diagnoses. We discussed further diagnostic testing if required and multiple treatment options including non-surgical and surgical treatment options. Extensive patient specific education and counseling was provided regarding the above mentioned diagnoses.     The patient has continued to have low back pain across the waistline with occasional radiating pain down the  lower extremities. Her CT myelogram shows multilevel spondylosis, but no significant stenosis. She states her pain is improved with previous trochanteric bursa injections. As such, it was discussed that her pain is likely unrelated to the spine if her pain is improved with her hip bursa injection. I do not recommend any surgical intervention of the lumbar spine at this time. I recommend she continue to maximize conservative care such as formal physical therapy. We will proceed with conservative treatment of physical therapy for lumbar spine of modalities, lumbar stabilization and core strengthening.     It was discussed of incidental finding of a left renal cyst appreciated on the CT scan. She was instructed to follow up with her primary care physician regarding this.     Medical management is initiated in office today. A prescription of:  Requested Prescriptions     Signed Prescriptions Disp Refills    diclofenac Sodium 1% gel 100 g 0     Sig: Apply 4 g topically four (4) times daily To affected area..    lidocaine 5% patch 30 patch 0     Sig: Apply 1 patch to affected area PRN pain, use 12 hours on then 12 hours off. No more than 1 patch a day..        This prescription was sent electronically to the pharmacy.  Medication counseling was performed.    It was agreed proceed with:     [x]  Physical therapy via a spine neutral approach with modalities.   []  Home exercise program and activity modification including low-impact activities and core strengthening.  []  NSAIDS if there are no GI or kidney issues.   []  Neuromodulator medications- a prescription for Gabapentin 300 mg TID was provided to the patient.   []  Muscle relaxer - Flexeril   []  Pain medications including Tramadol or narcotics. A signed opoid treatment agreement was obtained.    []  Oral steroid dose pack.   [x]  Topical creams, ointments, and medications.   []  Weight loss.   []  Smoking cessation.   []  Complementary and alternative treatment options such as acupuncture and/or chiropractic treatments and/or experimental treatments such as stem cell injections.  []  Diagnostic and therapeutic lumbar transforaminal epidural steroid injection at   []  Diagnostic and therapeutic cervical/lumbar epidural steroid injection at   []  Diagnostic and therapeutic cervical selective nerve root steroid injection at   []  Diagnostic and therapeutic injection at   []  Pain management referral for evaluation and treatment.   []  MRI of   to evaluate for neurologic compression.   []  CT myelogram of the lumbar spine  to assess neurologic compression.   []  Electrodiagnostic testing (EMG/NCS).    []  Consultation referral to   []  Work restrictions including  []   Bracing as needed.  []  Surgical treatment with . Our appropriate preoperative protocol will be initiated.     All questions were answered to the patient?s satisfaction.      FOLLOW UP: The patient will follow up after a trial of physical therapy.  At that visit we will get the following xrays: None.      Lenord Fellers, MD  Orthopedic Spine Surgery  [facility]

## 2022-10-17 ENCOUNTER — Ambulatory Visit: Payer: BLUE CROSS/BLUE SHIELD

## 2022-10-17 DIAGNOSIS — M4150 Other secondary scoliosis, site unspecified: Secondary | ICD-10-CM

## 2022-10-17 DIAGNOSIS — M47816 Spondylosis without myelopathy or radiculopathy, lumbar region: Secondary | ICD-10-CM

## 2022-10-17 DIAGNOSIS — M48061 Spinal stenosis, lumbar region without neurogenic claudication: Secondary | ICD-10-CM

## 2022-10-17 DIAGNOSIS — M5136 Other intervertebral disc degeneration, lumbar region: Secondary | ICD-10-CM

## 2022-10-17 MED ORDER — DICLOFENAC SODIUM 1 % EX GEL
4 g | Freq: Four times a day (QID) | TOPICAL | 0 refills | Status: AC
Start: 2022-10-17 — End: ?

## 2022-10-17 MED ORDER — LIDOCAINE 5 % EX PTCH
MEDICATED_PATCH | 0 refills | Status: AC
Start: 2022-10-17 — End: ?

## 2022-10-25 ENCOUNTER — Ambulatory Visit: Payer: BLUE CROSS/BLUE SHIELD

## 2022-10-25 DIAGNOSIS — M1611 Unilateral primary osteoarthritis, right hip: Secondary | ICD-10-CM

## 2022-10-25 DIAGNOSIS — M25552 Pain in left hip: Secondary | ICD-10-CM

## 2022-10-25 DIAGNOSIS — M25551 Pain in right hip: Secondary | ICD-10-CM

## 2022-10-25 MED ADMIN — LIDOCAINE HCL 1 % IJ SOLN: INTRA_ARTICULAR | @ 20:00:00 | Stop: 2022-10-25

## 2022-10-25 MED ADMIN — BUPIVACAINE HCL 0.25 % IJ SOLN: INTRA_ARTICULAR | @ 20:00:00 | Stop: 2022-10-25

## 2022-10-25 MED ADMIN — METHYLPREDNISOLONE ACETATE 40 MG/ML IJ SUSP: INTRA_ARTICULAR | @ 20:00:00 | Stop: 2022-10-25

## 2022-10-25 NOTE — Progress Notes
Date: 10/25/2022  Name: Adrienne Love   MRN#: [id number]   DOB: [birth year]   Age: 52 y.o.  ___________________________________________________________________________________________________________________________________    Dr. Leane Call, DO    The patient was seen at the [facility] office.     REASON FOR VISIT: Bilateral hips    INTERIM HISTORY: Adrienne Love presents today for orthopedic evaluation of her persistent bilateral hip pain. The patient denies any injury to the bilateral hips, however, notes persistent pain for approximately since 2000. her pain is dull in quality and is located over the lateral hip with radiation into her lower back. It is a 7/10 in severity and intermittent in frequency. The hip pain is exacerbated with weight-bearing activity and is interfering with her activities of daily living. There is stiffness and pain present at night associated with the hip. Patient has failed conservative treatments as noted below and pain has been worsening.     Prior Treatment Has Included:   []   No previous treatment   []   Ice/Heat   [x]   NSAID   []   Physical therapy   [x]   Cortisone injection- Dr. Vivianne Master multiple bursae injections for the past 5 years, noting significant relief in February 2024.   []   Pain meds     Past Medical History:   Past Medical History:   Diagnosis Date    Anemia     Arthritis     Asthma     Cancer (HCC/RAF)     breast    DVT (deep venous thrombosis) (HCC/RAF)     GERD (gastroesophageal reflux disease)     Heart disease     Hypertension     Pacemaker 2008       Past Surgical History:   Past Surgical History:   Procedure Laterality Date    BREAST LUMPECTOMY Right     CARDIAC PACEMAKER PLACEMENT      CHOLECYSTECTOMY  2000    foot realignment Left 2014    GASTRIC BYPASS  2000    HYSTERECTOMY  2010    REDUCTION MAMMAPLASTY  2010       Medications:   Current Outpatient Medications   Medication Sig    diclofenac Sodium 1% gel Apply 4 g topically four (4) times daily To affected area..    flecainide 100 mg tablet Take 1 tablet (100 mg total) by mouth two (2) times daily.    Levalbuterol HCl (XOPENEX IN) Inhale as needed for.    levocetirizine 5 mg tablet Take 1 tablet (5 mg total) by mouth daily.    lidocaine 5% patch Apply 1 patch to affected area PRN pain, use 12 hours on then 12 hours off. No more than 1 patch a day..    losartan 25 mg tablet Take 1 tablet (25 mg total) by mouth daily.    Metoprolol Succinate 50 MG CS24 50 mg.    metoprolol tartrate 50 mg tablet Take 1 tablet (50 mg total) by mouth two (2) times daily.    omeprazole 20 mg DR capsule Take 1 capsule (20 mg total) by mouth daily.    triamterene-hydroCHLOROthiazide (DYAZIDE) 37.5-25 mg capsule Take 1 capsule by mouth daily.     No current facility-administered medications for this visit.       Allergies:   Allergies   Allergen Reactions    Morphine      Other Reaction(s): Abdominal pain    Patient Reported Allergy via Notable    Bacitracin-Polymyxin B     Allergies Imported From  [facility]      Povidone-Iodine, Neomycin/Polymyxin B GU    Povidone Iodine     Iodine Rash       Social history:   Social History     Tobacco Use    Smoking status: Former    Smokeless tobacco: Never       Family history: No family history on file.    REVIEW OF SYSTEMS: The 14-point review of systems as documented today in the medical record is remarkable for the positive orthopedic problems discussed above and their relevance was considered with respect to Constitutional, ENT, Cardiovascular, GU, Skin, Neurologic, Endocrine, Hematologic, Psychiatric, Gastrointestinal, Respiratory, Eyes and Allergic/Immunologic systems.     PHYSICAL EXAM:   There were no vitals filed for this visit. There is no height or weight on file to calculate BMI.  Constitutional: Adrienne Love is a 52 y.o. female in no acute distress.   Psyc: The patient is alert and oriented x3 with a normal mood and affect.   HENT: AT/NC; hearing intact   Eyes: PERRLA. Extra-ocular movements are intact.   Skin: Normal temperature. There are no rashes, ulcerations, or lesions.   Lymphatic: No induration or enlargement.   Extremities: No swelling or calf tenderness. clubbing. cyanosis.   Gait:  []   Antalgic [x]    Normal []   Trendelenburg   Assistive devices: none    HIP EXAM   Right Hip Exam:   No surgical incisions or scars are noted.  Balance disturbance: []   Yes [x]  No  Snapping or clicking: []   Yes [x]  No  Erythema present: []   Yes [x]  No   Ecchymosis present []  Yes [x]  No  Swelling present: []  Yes [x]  No  Palpable masses: []  Yes [x]  No   Tenderness to palpation: Palpation is negative unless marked below.   []  Iliac crest  [x]  Greater trochanter- moderate  []  Ischium  []  SI joint  []  Pubic Symphysis  []  Log roll  ROM(degrees/pain): There is no pain with ROM unless marked below.  Flexion  110?   []  with pain laterally. []  with pain in the groin.  Extension 0?    []  with pain laterally. []  with pain in the groin.  Abduction 30?  []  with pain laterally. []  with pain in the groin.  Adduction 20?  []  with pain laterally. []  with pain in the groin.  Internal rotation 25?  []  with pain laterally. []  with pain in the groin.  External rotation 45? []  with pain laterally. []  with pain in the groin.  Motor Strength:   Hip flexion 5/5  Hip extension 5/5   Leg abductors (Superior gluteal (L4-S1)) 5/5  Knee extension (Femoral (L2-L4)) 5/5  Knee Flexion (Sciatic (L3-L4))  5/5  Neurovascular:   Sensation is normal throughout  Upper anterior thigh (L2) Normal  Lateral thigh (L2) Normal  Mid anterior thigh (L3) Normal  Knee, inside of leg (L4) Normal  Dorsum of foot (L5) Normal  Lateral border of foot (S1) Normal  Straight leg test:   []  Positive [x]  Negative []  Not Tested  Vascular   DP: Palpable  PT: Palpable  Special Tests:   Impingement (FADDIR): []  Positive [x]  Negative []  Not Tested  Trendelenburg sign:  []  Positive [x]  Negative []  Not Tested  Lower back:   There is no tenderness, deformity or injury. All special test are normal.     Left Hip Exam:   No surgical incisions or scars are noted.  Balance disturbance: []  Yes [x]  No  Snapping or clicking: []  Yes [x]  No  Erythema present: []  Yes [x]  No   Ecchymosis present: []  Yes [x]  No  Swelling present: []  Yes [x]  No  Palpable masses: []  Yes [x]  No   Tenderness to palpation: Palpation is negative unless marked below.   []  Iliac crest  [x]  Greater trochanter- moderate  []  Ischium  []  SI joint  []  Pubic Symphysis  []  Log roll  ROM(degrees/pain): There is no pain with ROM unless marked below.  Flexion  110?   []  with pain laterally. []  with pain in the groin.  Extension 0?    []  with pain laterally. []  with pain in the groin.  Abduction 30?  []  with pain laterally. []  with pain in the groin.  Adduction 20?  []  with pain laterally. []  with pain in the groin.  Internal rotation 25?  []  with pain laterally. []  with pain in the groin.  External rotation 45? []  with pain laterally. []  with pain in the groin.  Motor Strength:   Hip flexion 5/5  Hip extension 5/5   Leg abductors (Superior gluteal (L4-S1)) 5/5  Knee extension (Femoral (L2-L4)) 5/5  Knee Flexion (Sciatic (L3-L4))  5/5  Neurovascular:   Sensation is normal throughout  Upper anterior thigh (L2) Normal  Lateral thigh (L2) Normal  Mid anterior thigh (L3) Normal  Knee, inside of leg (L4) Normal  Dorsum of foot (L5) Normal  Lateral border of foot (S1) Normal  Straight leg test:   []  Positive [x]  Negative []  Not Tested  Vascular   DP: Palpable  PT: Palpable  Special Tests:   Impingement (FADDIR): []  Positive [x]  Negative []  Not Tested  Trendelenburg sign:  []  Positive [x]  Negative []  Not Tested      Radiographs: Ap pelvis and four views of the bilateral hip were ordered, taken, and interpreted by me from an orthopedic standpoint here today. They demonstrate mild osteoarthritis with approximately percent joint space narrowing, osteophyte formation, and subchondral sclerosis.     Impression:   Moderate bilateral hip bursitis, left worse than right  Pacemaker/Defibrillator- Dr. Russ Halo- non MRI compatible   Osteoporosis/Osteopenia   Bilateral lumbosacral radiculopathy, L4-L5 radiculopathy  Gastric bypass    Plan: We had a lengthy discussion with the patient today regarding her pain. I discussed with the patient their physical exam findings, radiographs and imaging, above diagnoses, and current treatment options along with their risks and benefits.     The patient's subjective complaints and objective findings are consistent with moderate bilateral hip bursitis, with the left worse than right. We discussed the treatment options including anti-inflammatory medications, repeat cortisone versus PRP injections, physical therapy, bracing, and surgical interventions. Per the patient, she has undergone multiple bursae injections for the past 5 years with Dr. Vivianne Master. She does finding benefit with these injections for some time before insidious onset of return. At this time, a left intra-articular hip corticosteroid injection was recommended to the patient. This injection is both diagnostic and therapeutic, as to determine if she is experiencing referred hip pain. The risks associated with the injection were discussed with the patient and they have elected to proceed. The patient was given an informative handout from the [employer] regarding their conditions. She was also given a handout for PRP injection on today's visit.     We will avoid prescribing oral NSAIDs secondary to her gastric bypass. We may consider a PRP injection if her symptoms are not improved her left hip IA CSI.  Patient educated on the importance of weight loss through proper, improved nutrition and increased cardiovascular exercise 30-45 minutes for 5-6 days per week. Patient educated on low impact exercises, such as elliptical machine, stationary bike, and aquatic therapy/pool therapy.     We had a lengthy discussion with the patient about obesity.   We did urge them to start a structured weight loss program.  We also did discuss [employer] and a consultation with Dr. Selina Cooley Joint/Bursa Drain/Inject: L hip joint    Date/Time: 10/25/2022 8:30 AM    Performed by: Leane Call, DO  Authorized by: Leane Call, DO    Consent Given by:  Patient  Site marked: the procedure site was marked    Timeout: prior to procedure the correct patient, procedure, and site was verified    Indications:  Pain and diagnostic evaluation  Location:  Hip  Site:  L hip joint  Prep: patient was prepped and draped in usual sterile fashion    Approach:  Anterolateral  Guidance: ultrasound    Needle Size:  22 G (3.5 inch spinal needle)  Medications:  2 mL lidocaine 1%; 2 mL BUPivacaine 0.25%; 80 mg methylPREDNISolone ACETATE 40 mg/mL  Patient tolerance:  Patient tolerated the procedure well with no immediate complications   PROCEDURE:  Hip joint injection    Informed Consent: The risks of the hip joint injection was first reviewed with the patient, including infection, bleeding, and nerve damage. The benefits (reduced or eliminated pain, improved stability), adverse effects (soreness, stiffness, temporarily increased pain), and post-injection expectations also were explained.  The patient orally consented to the procedure and understood the risks, benefits, and alternatives.     The hip was prepared in the usual sterile fashion using povidone-iodine and alcohol.  The patient's right hip was evaluated using a Sonosite M Turbo ultrasound machine using a 12 MHz linear probe scanning in the longitudinal and transverse views using both regular B mode as well as Power Doppler. Static and dynamic images were obtained and archived. The ultrasound guidance is medically necessary to confirm needle placement to assure intra-articular injectate is within the joint. The listed needle above with a 10cc syringe was used with the medication listed above to perform an arthrocentesis of the joint which revealed no frank blood, and the medication was then administered intra-articularly easily without resistance.  The site was then cleaned and covered with a bandage after hemostasis was met. The procedure was tolerated well without any complications. The patient was discharged in stable condition with post-injection instructions to avoid standing and walking for very long extended periods of time and high intensity activities.      Follow up: The patient will follow up in 1 week for possible left hip bursae injection.     Thank you for allowing me to participate in Adrienne Love?s care. Please do not hesitate to contact me if you have any questions or concerns.          Leane Call, D.O.  Adult Reconstruction Specialist  ORTHOPEDIC SURGERY  10/25/2022

## 2022-11-25 ENCOUNTER — Telehealth: Payer: BLUE CROSS/BLUE SHIELD

## 2022-11-25 NOTE — Progress Notes
Date: 11/25/2022  Name: Adrienne Love   MRN#: [id number]   DOB: 18-Aug-1970   Age: 52 y.o.  ___________________________________________________________________________________________________________________________________    Dr. Leane Call, DO    The patient was evaluated via telephone at the [facility] office.     REASON FOR VISIT: Bilateral hips    INTERIM HISTORY: TONASIA DELAIR presents via telephone encounter  for orthopedic follow-up of her persistent bilateral hip pain. Since the patient's last office visit, her symptoms have markedly improved with her left hip. The cortisone injection performed on the last visit provided 80-90% relief that is still providing her relief.     She continues to struggle with her right hip. The patient has increased discomfort with putting shoes and socks on and also getting in and out of a car.     Prior Treatment Has Included:   []   No previous treatment   []   Ice/Heat   [x]   NSAID   []   Physical therapy   [x]   Cortisone injection- Dr. Vivianne Master multiple bursae injections for the past 5 years, noting significant relief in February 2024.   []   Pain meds     PHYSICAL EXAM:   Not performed as this was a telephone encounter.     Radiographs: Ap pelvis and four views of the bilateral hip were ordered, taken, and interpreted by me from an orthopedic standpoint here today. They demonstrate mild osteoarthritis with approximately percent joint space narrowing, osteophyte formation, and subchondral sclerosis.     Impression:   Moderate bilateral hip bursitis, left worse than right  Pacemaker/Defibrillator- Dr. Russ Halo- non MRI compatible   Osteoporosis/Osteopenia   Bilateral lumbosacral radiculopathy, L4-L5 radiculopathy  Gastric bypass    Plan: We had a lengthy discussion with the patient today regarding her pain. I discussed with the patient their physical exam findings, radiographs and imaging, above diagnoses, and current treatment options along with their risks and benefits.     The patient's subjective complaints and objective findings are consistent with moderate bilateral hip bursitis, with the right than the left. The patient is doing well following her left intra-articular cortisone injection of her left hip noting 80-90% improvement. At this time we will continue with observation. The patient was encouraged to continue with their physician-directed exercises at home.     Patient continues to have pain with her right hip. The previous cortisone injection helped her tremendously with her left hip. As such, at her request, we?ll plan on repeating to her right hip this as this keeps her quite functional. The risks associated with the injection were discussed with the patient and they have elected to proceed.     We will avoid prescribing oral NSAIDs secondary to her gastric bypass.       We had a lengthy discussion with the patient about obesity.   We did urge them to start a structured weight loss program.  We also did discuss [employer] and a consultation with Dr. Ricarda Frame      Follow up: The patient will follow up in 1 month for possible right hip intra-articular cortisone injection.     Thank you for allowing me to participate in Azaelia Kincannon Kober?s care. Please do not hesitate to contact me if you have any questions or concerns.          Leane Call, D.O.  Adult Reconstruction Specialist  ORTHOPEDIC SURGERY  11/25/2022    Patient Consent to Telehealth Questionnaire   No flowsheet data found.  - I  agree  to be treated via a telephone encounter and acknowledge that I may be liable for any relevant copays or coinsurance depending on my insurance plan.  - I understand that this telephone encountervideo visit is offered for my convenience and I am able to cancel and reschedule for an in-person appointment if I desire.  - I also acknowledge that sensitive medical information may be discussed during this telephone encounter appointment and that it is my responsibility to locate myself in a location that ensures privacy to my own level of comfort.  - I also acknowledge that I should not be participating in a telephone encounter in a way that could cause danger to myself or to those around me (such as driving or walking).  If my provider is concerned about my safety, I understand that they have the right to terminate the visit.       TIME ALLOCATION     I spent total 10 minutes formulating today's visit which included:     [x] Preparing to see the patient (e.g., review of tests)   [x] Obtaining and/or reviewing separately obtained history   [x] Performing a medically appropriate examination and/or evaluation   [x] Counseling and educating the patient/family/caregiver   [x] Documenting clinical information in the electronic or other health record   [x] Independently interpreting results (not separately reported) and communicating results to the patient/family/caregiver   [] Care coordination (not separately reported)

## 2022-11-26 DIAGNOSIS — M1611 Unilateral primary osteoarthritis, right hip: Secondary | ICD-10-CM

## 2022-12-02 NOTE — Progress Notes
DATE: 12/09/2022   NAME: ELLEANOR GUYETT   MRN: [id number]   DOB: 1970-08-25   AGE: 52 y.o.     Patient was seen in our [facility] by: [name]. [name], MD    Chief Complaint:  No chief complaint on file.      History:   This is a  52 y.o. year old female who returns for re-evaluation of her low back pain radiating down the right lateral lower extremity currently.    ***    She has been treated with trochanteric bursa injections and knee injections in the past which have not improved her pain. The patient did previously have injections to the low back with [facility] about 15 years ago.      Of note, the patient has a pacemaker and defibrillator which prevents her from getting a MRI. The patient does also have a history of gastric bypass and is not able to take oral anti-inflammatories.     They deny numbness, tingling, weakness, bowel or bladder incontinence, saddle anesthesia, fevers, chills, unintentional weight loss, night pain, hand clumsiness, difficulty with buttons, balance issues, or night sweats except as noted above.    PRIOR TREATMENT HAS INCLUDED:   []   No previous treatment   []   Ice/Heat   [x]   Activity Modification   []   NSAID   []   Gabapentin  []   Muscle relaxer  []   Oral steroids  []   Home exercise regimen  []   Physical therapy   [x]   Cortisone/epidural injection   []   Bracing  []   Pain medication   []   Acupuncture  []   Chiropractic care    Injections History:  The patient had previous injection about 15 years ago, but the injection reports are not available for review today.      Spine Surgical History:  None.    REVIEW OF SYSTEMS: The review of systems as documented today in the medical record is remarkable for the positive orthopedic problems discussed above and is available for review in the patient's chart.    PHYSICAL EXAM:    There were no vitals filed for this visit.  There is no height or weight on file to calculate BMI.  GENERAL: Well-developed, well-nourished, appears stated age, no apparent distress    PULMONARY: Nonlabored respirations.      CARDIAC: Palpable radial pulses with regular rate.     SKIN: No previous spine incisions are present.     GAIT: Narrow-based, reciprocal gait. Normal heel, toe, and tandem walk.    LUMBAR SPINE EXAMINATION:    Gait and Station  The patient arises from seated to standing without difficulty    Range Of Motion     Pain with Flexion: Not performed  Pain with Extension: Not performed  Pain with Rotation: Not performed    Motor Strength   Right   Left  Hip Flexors     5   5  Quadriceps     5   5  Tibialis Anterior    5   5  Extensor Hallucis Longus   5   5  Ankle Plantarflexors     5   5      Sensation  Light touch sensation is intact in both lower extremities.     Reflexes    Right  Left  Patellar      2  2  Achilles      2  2    Tenderness  Lumbosacral midline  None   Paraspinal muscles    None   Greater Trochanteric Bursa None   SI Joints    None      Long Track Signs  Clonus    Negative   Babinski    Negative    SPECIAL TESTING:  Hip Exam: Not Performed  Knee Exam: Not performed  Straight Leg Raise: Negative bilaterally.       IMAGING & RADIOGRAPHS:  X-RAYS: No new radiographs were obtained in office today.     LUMBAR SPINE MRI: The patient is not able to complete a MRI secondary to her pacemaker and defibrillator.     LUMBAR SPINE CT MYELOGRAM SCAN: CT myelogram of the lumbar spine was performed at [facility] on Sep 30, 2022. CT scan is reviewed and interpreted by myself from an orthopedic standpoint in office today with the patient. CT scan reveals:  Chronic mild to moderate anterior wedging deformity at the T12 level. Lumbar vertebral body heights preserved. Mild chronic Schmorl's node deformities at several levels. Mild levoconvex curvature of the lumbar spine. Moderate loss of disc height L4-L5 and L5-S1 levels and mild loss of disc height throughout the remainder of the lumbar spine.  Conus terminates at the L1-L2 level and is normal in morphology.  T12-L1 level, canal and foramen are patent.  L1-L2 level, canal and foramen are patent.  L2-L3 level, canal and foramen are patent. Mild left and mild-to-moderate right-sided facet changes.  L3-L4 level, mild facet changes and hypertrophy of ligamentum flavum. Central canal and foramen are patent.  L4-L5 level, broad-based disc bulge measuring 2 mm. Minimal facet changes. Minimal canal stenosis. Mild right foraminal narrowing due to endplate osteophyte and mild right-sided facet changes. Left foramen is patent.  L5-S1 level, central canal is patent. Mild left and mild-to-moderate right foraminal narrowing due to endplate osteophyte and facet changes. Right foraminal narrowing accentuated by scoliotic curvature.  Visualized SI joints are preserved. Paraspinal musculature is unremarkable. Partial visualization of prominent left renal cyst.    My personal interpretation of the CT myelogram reveals:  There is a previous compression fracture at T12. There is multilevel spondylosis.    Diagnosis:  No diagnosis found.         MEDICAL DECISION MAKING & PLAN:  I had an extensive discussion with the patient regarding their symptoms, physical exam findings, imaging findings, and the above-mentioned diagnoses. We discussed further diagnostic testing if required and multiple treatment options including non-surgical and surgical treatment options. Extensive patient specific education and counseling was provided regarding the above mentioned diagnoses.     ***    Medical management is initiated in office today. A prescription of:  Requested Prescriptions      No prescriptions requested or ordered in this encounter        This prescription was sent electronically to the pharmacy.  Medication counseling was performed.    It was agreed proceed with:     []  Physical therapy via a spine neutral approach with modalities.   []  Home exercise program and activity modification including low-impact activities and core strengthening.  []  NSAIDS if there are no GI or kidney issues.   []  Neuromodulator medications- a prescription for Gabapentin 300 mg TID was provided to the patient.   []  Muscle relaxer - Flexeril   []  Pain medications including Tramadol or narcotics. A signed opoid treatment agreement was obtained.    []  Oral steroid dose pack.   []  Topical creams, ointments, and medications.   []   Weight loss.   []  Smoking cessation.   []  Complementary and alternative treatment options such as acupuncture and/or chiropractic treatments and/or experimental treatments such as stem cell injections.  []  Diagnostic and therapeutic lumbar transforaminal epidural steroid injection at   []  Diagnostic and therapeutic cervical/lumbar epidural steroid injection at   []  Diagnostic and therapeutic cervical selective nerve root steroid injection at   []  Diagnostic and therapeutic injection at   []  Pain management referral for evaluation and treatment.   []  MRI of   to evaluate for neurologic compression.   []  CT myelogram of the lumbar spine  to assess neurologic compression.   []  Electrodiagnostic testing (EMG/NCS).    []  Consultation referral to   []  Work restrictions including  []   Bracing as needed.  []  Surgical treatment with . Our appropriate preoperative protocol will be initiated.     All questions were answered to the patient?s satisfaction.      FOLLOW UP: The patient will follow up {Blank single:19197::''in 1 Week'',''in 2 Weeks'',''in 3 Weeks'',''in 4 weeks'',''in 6 weeks'',''in 1 month'',''in 2 Months'',''in 3 Months'',''in 4 months'',''in 6 Months'',''in 1 Year'',''on an as needed basis'',''on an as needed basis per patient request'',''for their preoperative appointment'',''for their first post operative appointment'',''after completing the MRI for review of those results'',''after completion of the nerve conduction study'',''after completion of the CT scan for review of those results'',''after a trial of physical therapy'',''2-3 weeks following the injection'',''***''}.  At that visit we will get the following xrays: ***.      Lenord Fellers, MD  Orthopedic Spine Surgery  [facility]

## 2022-12-09 ENCOUNTER — Ambulatory Visit: Payer: BLUE CROSS/BLUE SHIELD

## 2022-12-16 ENCOUNTER — Ambulatory Visit: Payer: BLUE CROSS/BLUE SHIELD | Attending: Medical

## 2022-12-16 DIAGNOSIS — M1611 Unilateral primary osteoarthritis, right hip: Secondary | ICD-10-CM

## 2022-12-16 DIAGNOSIS — Z01818 Encounter for other preprocedural examination: Secondary | ICD-10-CM

## 2022-12-16 DIAGNOSIS — M1711 Unilateral primary osteoarthritis, right knee: Secondary | ICD-10-CM

## 2022-12-16 DIAGNOSIS — M25561 Pain in right knee: Secondary | ICD-10-CM

## 2022-12-16 MED ORDER — OXYCODONE-ACETAMINOPHEN 5-325 MG PO TABS
1-2 | ORAL_TABLET | Freq: Four times a day (QID) | ORAL | 0 refills | Status: AC | PRN
Start: 2022-12-16 — End: 2022-12-31

## 2022-12-16 MED ADMIN — METHYLPREDNISOLONE ACETATE 40 MG/ML IJ SUSP: INTRA_ARTICULAR | @ 19:00:00 | Stop: 2022-12-16

## 2022-12-16 MED ADMIN — LIDOCAINE HCL 1 % IJ SOLN: INTRA_ARTICULAR | @ 19:00:00 | Stop: 2022-12-16

## 2022-12-16 MED ADMIN — BUPIVACAINE HCL 0.25 % IJ SOLN: INTRA_ARTICULAR | @ 19:00:00 | Stop: 2022-12-16

## 2022-12-16 NOTE — Progress Notes
Date: 12/16/2022  Name: Adrienne Love   MRN#: [id number]   DOB: 06/22/1970   Age: 52 y.o.    Dr. Leane Call, DO  Thurmon Fair, PA-C     The patient was seen at the [facility] office by Thurmon Fair, PA-C.    Reason for Visit: Right knee pain.  Right hip pain.    History: Adrienne Love is a 52 y.o. female who returns to our office for follow-up and to discuss her upcoming knee replacement surgery.  The patient continues to complain of significant pain and mechanical symptomatology in the knee.  There has been no improvement with conservative treatment. The patient is now requesting surgical intervention.    The patient?s intake sheet, updated today, including past medical history, past surgical history, medicines, allergies, social and family history have been reviewed by me with the patient and signed by both the patient and myself in the room.    She is also wanting to be seen for her right hip pain, her pain is localized over the posterior hip radiates laterally, she denies any groin pain.  She had a left intra-articular hip cortisone injection which she found tremendous improvement and she is requesting this injection for her right hip as she feels that her right hip pain is very similar to her left hip pain.  Pain is worse in the hip with walking and standing and putting on socks and shoes and clothes.    KNEE REPLACEMENT GOAL:  After having recovered from knee replacement surgery, their goal is to walk longer without right knee pain..     Past Medical History:   Past Medical History:   Diagnosis Date    Anemia     Arthritis     Asthma     Cancer (HCC/RAF)     breast    GERD (gastroesophageal reflux disease)     Heart disease     Hypertension     Pacemaker 2008   Right knee pain   Right knee osteoarthritis  Currently has inserted defibrillator    Past Surgical History:   Past Surgical History:   Procedure Laterality Date    BREAST LUMPECTOMY Right     CARDIAC PACEMAKER PLACEMENT      CHOLECYSTECTOMY  2000 foot realignment Left 2014    GASTRIC BYPASS  2000    HYSTERECTOMY  2010    INSERT / REPLACE / REMOVE PACEMAKER  2020    REDUCTION MAMMAPLASTY  2010    VASCULAR SURGERY  2023   Current placement of defibrillator    Medications:   Current Outpatient Medications   Medication Sig    diclofenac Sodium 1% gel Apply 4 g topically four (4) times daily To affected area..    flecainide 100 mg tablet Take 1 tablet (100 mg total) by mouth two (2) times daily.    Levalbuterol HCl (XOPENEX IN) Inhale as needed for.    levocetirizine 5 mg tablet Take 1 tablet (5 mg total) by mouth daily.    lidocaine 5% patch Apply 1 patch to affected area PRN pain, use 12 hours on then 12 hours off. No more than 1 patch a day..    losartan 25 mg tablet Take 1 tablet (25 mg total) by mouth daily.    Metoprolol Succinate 50 MG CS24 50 mg.twice daily         omeprazole 20 mg DR capsule Take 1 capsule (20 mg total) by mouth daily.    triamterene-hydroCHLOROthiazide (DYAZIDE) 37.5-25 mg capsule  Take 1 capsule by mouth daily.     No current facility-administered medications for this visit.       Allergies:   Allergies   Allergen Reactions    Morphine      Other Reaction(s): Abdominal pain    Patient Reported Allergy via Notable    Bacitracin-Polymyxin B     Allergies Imported From [facility]      Povidone-Iodine, Neomycin/Polymyxin B GU    Povidone Iodine     Iodine Rash     The patient denies allergy to latex or tape.     Social history:   Social History     Tobacco Use    Smoking status: Former     Current packs/day: 0.00     Average packs/day: 1 pack/day for 3.0 years (3.0 ttl pk-yrs)     Types: Cigarettes     Start date: 05/03/1983     Quit date: 05/10/1986     Years since quitting: 36.6    Smokeless tobacco: Never   Substance Use Topics    Alcohol use: Yes     Alcohol/week: 1.8 oz     Types: 3 Shots of Liquor (1.5 oz) per week    Drug use: Never       Family history:   The patient affirms  having family history of blood clots.     Review of Systems: Negative for 14 point review of systems unless specified above.    Physical Examination:  Vitals:    12/16/22 0911   Weight: (!) (P) 226 lb (102.5 kg)   Height: (P) 5' 5'' (1.651 m)    Body mass index is 37.61 kg/m? (pended).  The patient is well developed, well nourished, and in no acute distress.    Body habitus is obese    Patient is oriented x 3, to place, time and person. Judgment, mood and affect are appropriate.      External exam of eyes, ears, nose and mouth reveals no deformities, scars or lesions.    Respirations are regular and unlabored.  Respiratory effort is normal with no evidence of abnormal intercostal retraction, or excessive use of accessory muscles.    Skin appears to be normal without rashes, lesions, or ulcers.    Pulse is regular.  No cyanosis, clubbing or edema is evident. Peripheral lower extremities are well perfused.     Patient has no movement disorder or loss of sensation except as described below.    Gait and station are within normal limits except as described below.  No amputations or fixed deformities are evident.    KNEE EXAM  The patient is grossly neurologically intact from L2-S1. No lymphedema.  Skin has no lesions. Compartments are soft.   Gait:  []  Antalgic [x]  Normal []  Trendelenburg   Assistive devices: none    Right Knee Exam:   Healed arthroscopic portals  Erythema present:                  []  Yes [x]  No   Bruising or ecchymosis:          []  Yes [x]  No   Palpable masses present:      []  Yes [x]  No   [x]  No effusion present   []  Small Effusion present []  Moderate Effusion present      []  Large effusion  present  Palpation tenderness severity: []  None   [x]  Mild   []  Moderate    []  Severe  Palpable tenderness location:              []   N/A              [x]  Medial joint line              []  Lateral joint line               []  Posteromedial joint line              []  Posterolateral joint line              []  Posterior knee              []  Patellofemoral joint line []  Diffusely               []  Quadriceps tendon              []  Patellar tendon              [x]  Pes anserine bursa              []  Patella              []  Diffusely        Range of motion is 0? extension to 115? flexion  Guarding and crepitus:           []  Yes [x]  No   Flexion contractures:              []  Yes [x]  No   The knee is unstable with 1+ laxity with Varus and Valgus stress at 0, 30, 90?Marland Kitchen      Special tests:   McMurray's test:                      []  Positive      [x]  Negative     []  Not Tested  Reverse McMurray's test:       [x]  Positive      []  Negative     []  Not Tested  Apley's test:                             []  Positive      [x]  Negative     []  Not Tested  Lachman's test:                       []  Positive      [x]  Negative     []  Not Tested  Anterior drawer test:               []  Positive      [x]  Negative     []  Not Tested  Posterior drawer test:              []  Positive      [x]  Negative     []  Not Tested  Patellar apprehension test:     []  Positive      [x]  Negative     []  Not Tested  Patellar grind test:                   []  Positive      [x]  Negative     []  Not Tested  Muscle Strength of the knee is 5/5. Patellar tracking is normal    Right Hip Exam:    Balance disturbance: []  Yes [x]  No  Snapping or clicking: []  Yes [x]  No  Erythema present: []  Yes [x]  No   Ecchymosis present: []  Yes [x]  No  Swelling present: []  Yes [x]  No  Palpable masses: []  Yes [x]  No   Tenderness to palpation: Palpation is negative unless marked below.   []  Iliac crest  [x]  Greater trochanter - Mild  []  Ischium  []  SI joint  []  Pubic Symphysis    ROM(degrees/pain): There is no pain with ROM unless marked below.  Flexion  110?   []  with pain laterally. []  with pain in the groin.  Extension 0?    []  with pain laterally. []  with pain in the groin.  Abduction 30?  [x]  with pain posterior hip. []  with pain in the groin.  Adduction 20?  []  with pain laterally. []  with pain in the groin.  Internal rotation 25?  []  with pain laterally. []  with pain in the groin.  External rotation 45? [x]  with pain posterior hip. []  with pain in the groin.  Motor Strength:   Hip flexion (L1) 5/5  Hip extension (S1) 5/5   Leg abductors (Superior gluteal (L4-S1)) 5/5  Knee extension (Femoral (L2-L4)) 5/5  Knee Flexion (Sciatic (L3-L4))  5/5  Dorsiflexion (L4-L5) 5/5  Plantar Flexion (S1) 5/5  Neurovascular:   Sensation  Upper anterior thigh (L2) Normal  Lateral thigh (L2) Normal  Mid anterior thigh (L3) Normal  Knee, inside of leg (L4) Normal  Dorsum of foot (L5) Normal  Lateral border of foot (S1) Normal  Straight leg test:   []  Positive [x]  Negative []  Not Tested  Vascular   DP: Palpable  PT: Palpable  Special Tests:   Impingement (FADDIR): []  Positive [x]  Negative []  Not Tested  Impingement (FABER): []  Positive [x]  Negative []  Not Tested  Trendelenburg sign:  []  Positive [x]  Negative []  Not Tested  Log roll   []  Positive [x]  Negative []  Not Tested     RADIOGRAPHS: 4 views of right knee and 3 view(s) comparison of the opposite knee were reviewed by me here at [facility] today and reviewed with the patient. They show severe osteoarthritis with lateral joint space narrowing, subchondral sclerosis, and periarticular spurring.     IMPRESSION:   Right knee severe osteoarthritis  Pacemaker/Defibrillator- Dr. Russ Halo- non MRI compatible   Ventricular tachycardia  Osteoporosis/Osteopenia   Suspicion of bilateral lumbosacral radiculopathy, L4-L5 radiculopath  Suspicion of neuropathy  Gastric bypass  Bilateral hip bursitis  Mild degenerative osteoarthritis of bilateral hip joints      Plan: We discussed the procedure to the patient and answered all their questions.  The patient understands the proposed procedure including the risks of bleeding, infection, damage to the nerves and vessels, some numbness along the area where the cutaneous skin nerves can be injured from incisions, stiffness, persistent pain and swelling, need for further surgery, loss of limb, loss of life.  No guarantee of result was given.  The patient gives an informed consent and wishes to proceed, understanding the risks, benefits and alternatives.    A model of the knee implant was used during today's discussion to teach the patient basic details of knee arthroplasty surgery and its function. We discussed the total knee prosthetic implant to be used is [employer].     The patient verbalized full understanding of the benefits and risks of the surgery and offered consent to perform the operation.    The patient will be provided with a set of TED compression stockings while admitted to the hospital. If not already acquired, agrees to obtain prior to surgery a walker/cane either through OTC purchase or through insurance coverage. The typical hospital  stay is leaving the day of surgery, and staying until cleared by PT and the physician hospitalist.     Occasionally patients will be sent to a rehab center after their hospital stay. The facility you are sent to will be determined by your insurance company, we can make recommendations but the final decision rests with your insurance carrier.    The patient will start outpatient physical therapy within 5 days after surgery and was provided with a prescription.     Additionally, she was provided with a prescription for all appropriate pain medications, DVT prophylaxis, and anti-inflammatories. The patient was prescribed the following medications:  Percoet 5-325   The patient is very hesitant of any NSAID/gabapentin or Lyrica use out of concern of her current V-tach status which is controlled by her pacemaker.  She wants cardiology approval for any NSAID or gabapentin/Lyrica use.  We will contact cardiology.  Aspirin 81 mg  Colace 100 mg twice daily     The cures database was reviewed today.    The patient will follow-up with our office approximately two weeks after the procedure.    The patient is recommended to use a walker postoperatively and then transition to a cane secondary to postoperative right knee pain after total knee replacement surgery.  The patient is ambulatory and more car stabilization for her right knee with use of a walker and then a cane.    Today?s visit consisted of 20 minutes of face to face time and an additions 25 minutes of record, lab, medical clearance, X-ray, and MRI review.    Available labs were reviewed today.  Abnormal labs have been directed back to the PTP to be cleared.    Regarding her right hip pain, we discussed the risks and benefits of a intra-articular cortisone injection as a diagnostic therapeutic procedure, patient would like to proceed with this today hopefully this improves her pain well if not we may consider MRI of her hips.    Following the right hip joint injection today the patient stated she had 95% improvement of her pain.    Large Joint/Bursa Drain/Inject: R hip joint    Date/Time: 12/16/2022 9:00 AM    Performed by: Thurmon Fair D., PA-C  Authorized by: Thurmon Fair D., PA-C    Consent Given by:  Patient  Site marked: the procedure site was marked    Timeout: prior to procedure the correct patient, procedure, and site was verified    Indications:  Pain and diagnostic evaluation  Location:  Hip  Site:  R hip joint  Prep: patient was prepped and draped in usual sterile fashion    Approach:  Anterolateral  Guidance: ultrasound    Needle Size:  22 G (5 inch spinal needle)  Medications:  2 mL lidocaine 1%; 2 mL BUPivacaine 0.25%; 80 mg methylPREDNISolone ACETATE 40 mg/mL  Patient tolerance:  Patient tolerated the procedure well with no immediate complications   PROCEDURE:  Hip joint injection    Informed Consent: The risks of the hip joint injection was first reviewed with the patient, including infection, bleeding, and nerve damage. The benefits (reduced or eliminated pain, improved stability), adverse effects (soreness, stiffness, temporarily increased pain), and post-injection expectations also were explained.  The patient orally consented to the procedure and understood the risks, benefits, and alternatives.     The hip was prepared in the usual sterile fashion using povidone-iodine and alcohol.  The patient's right hip was evaluated using a Sonosite M Turbo ultrasound machine using  a 12 MHz linear probe scanning in the longitudinal and transverse views using both regular B mode as well as Power Doppler. Static and dynamic images were obtained and archived. The ultrasound guidance is medically necessary to confirm needle placement to assure intra-articular injectate is within the joint. The listed needle above with a 10cc syringe was used with the medication listed above to perform an arthrocentesis of the joint which revealed no frank blood, and the medication was then administered intra-articularly easily without resistance.  The site was then cleaned and covered with a bandage after hemostasis was met. The procedure was tolerated well without any complications. The patient was discharged in stable condition with post-injection instructions to avoid standing and walking for very long extended periods of time and high intensity activities.       Thank you for allowing me to participate in Adrienne Love?s care. Please do not hesitate to contact me if you have any questions or concerns.    The patient was examined and evaluated by Thurmon Fair PA-C for Dr. [name], D.O.      Olegario Shearer D. Otilio Miu, PA-C  Physician assistant for Dr. [name], D.O.  [facility]   12/16/2022

## 2022-12-19 ENCOUNTER — Ambulatory Visit: Payer: BLUE CROSS/BLUE SHIELD

## 2022-12-20 MED ORDER — MELOXICAM 7.5 MG PO TABS
7.5 mg | ORAL_TABLET | Freq: Every day | ORAL | 6 refills | Status: AC
Start: 2022-12-20 — End: ?

## 2022-12-20 MED ORDER — PREGABALIN 75 MG PO CAPS
75 mg | ORAL_CAPSULE | Freq: Every evening | ORAL | 0 refills | Status: AC
Start: 2022-12-20 — End: ?

## 2022-12-20 NOTE — Progress Notes
Patients chart was reviewed. The risks, benefits, and alternative of surgery were discussed.I agree with the plan.

## 2022-12-29 NOTE — Progress Notes
Date: 01/03/2023  Name: Adrienne Love   MRN#: [id number]   DOB: 11-08-1970   Age: 52 y.o.  __________________________________________________________________________________________________________________________________     Dr. Leane Call, DO    The patient was seen at the [facility] office.    CHIEF COMPLAINT: Status-post Right total knee replacement DOS 12/21/2022    INTERIM HISTORY: [name] presents approximately two weeks post-operatively. her pain level today is a 2/10 in severity and is dull in quality. The patient denies fevers, chills or systemic complaints. The patient has been doing out patient physical therapy. The patient denies calf tenderness, leg swelling, cough, or chest pain. The patient states they are taking their narcotic. The patient is accompanied by her mother on today's visit.    PHYSICAL EXAM:  Psyc: she is alert and oriented x3 with a normal mood and affect.  HENT: AT/NC; hearing intact  Eyes: PERRLA. Extra-ocular movements are intact.  Skin: Normal temperature. There are no rashes, ulcerations, or lesions.  Lymphatic: No induration or enlargement.  Extremities: Mild Right lower extremity swelling. There is no calf tenderness with palpation. No clubbing. No cyanosis.  Gait:  [x]  Antalgic []  Normal []  Trendelenburg   Assistive devices: none    Right Knee:   Patient?s incision is healing well. There is no erythema present. There is mild bruising or ecchymosis.  A small effusion is present. Palpation of the knee reveals mild  tenderness over the knee diffusely. Negative Homan's sign.    Range of motion is 3? extension to 129? flexion with mild pain.  The knee is stable to varus/valgus stress at 10,30, and 90 degree. Normal patellar tracking. Muscle Strength of the knee is 4+/5 with respect to flexors and extendors.    RADIOGRAPHS:    3 views of the Right knee and 2 view(s) comparison of the opposite knee were ordered, obtained and reviewed by me here at [facility] today and reviewed with the patient. Radiographs demonstrate a Right TKA in good position and alignment.  There is optimal implant cement-bone interface without radiolucencies. There is no evidence of loosening or other abnormalities. The patella is well located.     IMPRESSION:   Status-post Right press-fit total knee replacement DOS 12/21/2022    PLAN: The patient is doing well.  her dressing was removed today. she can shower as normal but is to avoid any baths or soaks for the next four weeks. Daily ROM and strengthening exercises were encouraged today and the patient was given a handout demonstrating these. she is to continue physical therapy three times per week.     she is to continue taking Aspirin for the next week.she is to continue anti-inflammatory medication, and no refill of their narcotic was given today. They are to wean off the narcotics.  she may discontinue the use of the compression stockings. The patient is encouraged to move the knee and walk as much as tolerated.    The patient is to gradually resume her activity. she should begin with 10% of her normal activities and increase this by 10% per episode that is well tolerated. If the patient develops pain she is to return to the level where they had no pain for 2-3 days before increasing activity again.  She may discontinue with her cane once she feels safe to ambulate unassisted.     FOLLOW-UP: Return to clinic in 4 weeks for re-evaluation.    Thank you for allowing me to participate in Shakelia Scrivner Cuadra?s care. Please do not hesitate to  contact me if you have any questions or concerns.         Leane Call, D.O.  Adult Reconstruction Specialist  ORTHOPEDIC SURGERY  12/29/2022 contact me if you have any questions or concerns.         Leane Call, D.O.  Adult Reconstruction Specialist  ORTHOPEDIC SURGERY  12/29/2022

## 2022-12-30 MED ORDER — OXYCODONE-ACETAMINOPHEN 5-325 MG PO TABS
1-2 | ORAL_TABLET | Freq: Four times a day (QID) | ORAL | 0 refills | Status: AC | PRN
Start: 2022-12-30 — End: 2023-01-12

## 2023-01-03 ENCOUNTER — Ambulatory Visit: Payer: BLUE CROSS/BLUE SHIELD

## 2023-01-03 DIAGNOSIS — M1711 Unilateral primary osteoarthritis, right knee: Secondary | ICD-10-CM

## 2023-01-11 MED ORDER — OXYCODONE-ACETAMINOPHEN 5-325 MG PO TABS
1-2 | ORAL_TABLET | Freq: Four times a day (QID) | ORAL | 0 refills | Status: AC | PRN
Start: 2023-01-11 — End: ?

## 2023-01-13 MED ORDER — PREGABALIN 75 MG PO CAPS
75 mg | ORAL_CAPSULE | Freq: Every evening | ORAL | 0 refills | Status: AC
Start: 2023-01-13 — End: ?

## 2023-02-03 NOTE — Progress Notes
Date: 02/07/2023  Name: Adrienne Love   MRN#: [id number]   DOB: 07/27/1970   Age: 52 y.o.   ___________________________________________________________________________________________________________________________________     Dr. Leane Call, DO    The patient was seen at the [facility] office.    CHIEF COMPLAINT:  Status-post Right total knee replacement DOS 12/21/2022     INTERIM HISTORY: Adrienne Love presents approximately 7 weeks post-operatively. The pain level today is a 1/10 in severity and is dull in quality. The patient has knee pain that is intermittent in frequency. The patient denies fevers, chills or systemic complaints. They continue physical therapy, and are requesting more sessions. They are taking their narcotic and are requesting a refill. Overall, she feels 100% improvement following her date of surgery.     She also continues to have bilateral hip pain and is requesting a refill of lidocaine patches as these help her tremendously.     PHYSICAL EXAM:  Constitutional: Adrienne Love is a 52 y.o. female in no acute distress.   Psyc: she is alert and oriented x3 with a normal mood and affect.  HENT: AT/NC; hearing intact  Eyes: PERRLA. Extra-ocular movements are intact.  Skin: Normal temperature. There are no rashes, ulcerations, or lesions.  Lymphatic: No induration or enlargement.  Extremities: No right lower extremity swelling. There is no calf tenderness with palpation. No clubbing. No cyanosis.  Gait:  []  Antalgic [x]  Normal []  Trendelenburg   Assistive devices: none    Right knee:   Patient?s incision is healing well. There is no erythema present. No bruising or ecchymosis. A small effusion is present. Palpation of the knee reveals no tenderness over the medial aspect of the knee. Negative Homan's sign.    Range of motion is 3 extension to 132? flexion with mild pain. There is no crepitus with AROM and PROM. There is no quad lag. Knee is stable to varus/valgus stress at 0,30, and 90 degree. Normal patellar tracking. Muscle Strength of the knee is 4+/5 with respect to flexors and extendors.    RADIOGRAPHS: Not obtained today. Previous radiographs reviewed.     IMPRESSION:   Status-post Right total knee replacement DOS 12/21/2022   Moderate bilateral hip bursitis, left worse than right  Pacemaker/Defibrillator- Dr. Russ Halo- non MRI compatible   Osteoporosis/Osteopenia   Bilateral lumbosacral radiculopathy, L4-L5 radiculopathy  Gastric bypass    PLAN: The patient is doing well. she is to have activities as tolerated. They were instructed to continue their daily ROM and strengthening exercises. Their course of post-operative recovery was discussed today. she is to continue anti-inflammatory medication, no refill of their narcotic was given today. They are to wean off the narcotics. The patient is encouraged to move the knee and walk as much as tolerated.    Medical management is initiated in office today. A prescription of:  Requested Prescriptions     Signed Prescriptions Disp Refills    meloxicam 7.5 mg tablet 60 tablet 6     Sig: Take 1 tablet (7.5 mg total) by mouth daily.    lidocaine 5% patch 30 patch 0     Sig: Apply 1 patch to affected area PRN pain, use 12 hours on then 12 hours off. No more than 1 patch a day.        This prescription was sent electronically to the pharmacy.  Medication counseling was performed.    FOLLOW UP: Return to the [facility] clinic in 6-7 weeks.     Thank you for allowing  me to participate in Adrienne Love?s care. Please do not hesitate to contact me if you have any questions or concerns.         Leane Call, D.O.  Adult Reconstruction Specialist  ORTHOPEDIC SURGERY  02/07/2023

## 2023-02-07 ENCOUNTER — Ambulatory Visit: Payer: BLUE CROSS/BLUE SHIELD

## 2023-02-07 DIAGNOSIS — M1711 Unilateral primary osteoarthritis, right knee: Secondary | ICD-10-CM

## 2023-02-07 MED ORDER — LIDOCAINE 5 % EX PTCH
MEDICATED_PATCH | 0 refills | Status: AC
Start: 2023-02-07 — End: ?

## 2023-02-07 MED ORDER — MELOXICAM 7.5 MG PO TABS
7.5 mg | ORAL_TABLET | Freq: Every day | ORAL | 6 refills | Status: AC
Start: 2023-02-07 — End: ?

## 2023-03-13 NOTE — Progress Notes
Date: 03/17/2023  Name: Adrienne Love   MRN#: [id number]   DOB: Apr 15, 1971   Age: 52 y.o.  ______________________________________________________________________   Dr. Leane Call, DO  Thurmon Fair, PA-C     The patient was seen at the [facility] office by Thurmon Fair, PA-C.     CHIEF COMPLAINT: Status-post Right total knee replacement DOS 12/21/2022    INTERIM HISTORY: Adrienne Love presents approximately 3 months post-operatively. Previous knee pain is significantly improved and today is rated at 0/10. she over all feels 90% improved as compared to before the surgery. she is quite pleased and is able to perform all her activities of daily living with much more comfort. They are not taking their narcotic and are not requesting a refill.    She does report new onset of feeling as if her knee is hyperextending and buckling while walking. She states this occurs approx once a day and only occurs with walking. She also reports a recent increase in lower back pain around the same time she started experiencing instability. She has seen Dr. [name] previously for her back. She was provided referral to PT, however was not able to PT for her back due to undergoing her right knee surgery.     We have seen her previously for bilateral hips where she has underwent bilateral hip injections, which help improve her lateral and posterior hip pain. If possible, she would like to schedule appt for repeat hip injections.     PHYSICAL EXAM:  Constitutional: Adrienne Love is a 52 y.o. female in no acute distress.   Psyc: she is alert and oriented x3 with a normal mood and affect.  HENT: AT/NC; hearing intact  Eyes: PERRLA. Extra-ocular movements are intact.  Skin: Normal temperature. There are no rashes, ulcerations, or lesions.  Lymphatic: No induration or enlargement.  Extremities: No right lower extremity swelling. There is no calf tenderness with palpation. No clubbing. No cyanosis.  Gait:  []  Antalgic [x]  Normal []  Trendelenburg Assistive devices: none    Right knee: Patient?s incision is healing well. There is no erythema present. No bruising or ecchymosis.  A small effusion is present. Palpation of the knee reveals no tenderness. Negative Homan's sign.    Range of motion is  0 with AROM, -2 with PROM  extension to 132? flexion with no pain. There is no crepitus with AROM and PROM. There is no quad lag.  Knee is stable to varus/valgus stress at 0,30, and 90 degree. Normal patellar tracking. Muscle Strength of the knee is 5/5 with respect to flexors and extendors.    RADIOGRAPHS: Not obtained today. Previous radiographs reviewed.     IMPRESSION:  Status post Right Total knee arthroplasty DOS 12/21/2022  Moderate bilateral hip bursitis, left worse than right  Pacemaker/Defibrillator- Dr. Russ Halo- non MRI compatible   Osteoporosis/Osteopenia   Bilateral lumbosacral radiculopathy, L4-L5 radiculopathy  Gastric bypass    PLAN: The patient is doing well. she is to have activities as tolerated. They were instructed to continue their daily ROM and strengthening exercises. Their course of post-operative recovery was discussed today. she is to continue her anti-inflammatory. She is no longer taking narcotics. The patient is encouraged to move the knee and walk as much as tolerated. Antibiotic prophylaxis before invasive procedures was discussed.    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 We discussed following up with spine team for further evaluation and continuing to monitor her more closely to see if the instability of her right knee progresses.     She reminds [place] that the bilateral hip injections previously have helped with not only her hip joint pain, but also helps with her lower back pain. Per her request, we will schedule appointment for repeat bilateral hip injections in attempt to provide her with immediate, maximum relief and evaluate if this helps her symptoms as well.     FOLLOW UP:   Follow up with Dr. [name] next available  Follow up for bilateral hip injections    Thank you for allowing me to participate in Adrienne Love?s care. Please do not hesitate to contact me if you have any questions or concerns.    The patient was examined and evaluated by Thurmon Fair PA-C for Dr. [name], D.O.      Olegario Shearer D. Otilio Miu, PA-C  Physician assistant for Dr. [name], D.O.  [facility]  Date: 03/17/2023

## 2023-03-17 ENCOUNTER — Ambulatory Visit: Payer: BLUE CROSS/BLUE SHIELD | Attending: Medical

## 2023-03-17 DIAGNOSIS — M1711 Unilateral primary osteoarthritis, right knee: Secondary | ICD-10-CM

## 2023-03-17 DIAGNOSIS — M25561 Pain in right knee: Secondary | ICD-10-CM

## 2023-03-17 MED ORDER — LIDOCAINE 5 % EX PTCH
MEDICATED_PATCH | 0 refills | Status: AC
Start: 2023-03-17 — End: ?

## 2023-03-17 MED ORDER — MELOXICAM 7.5 MG PO TABS
7.5 mg | ORAL_TABLET | Freq: Every day | ORAL | 6 refills | Status: AC
Start: 2023-03-17 — End: ?

## 2023-03-20 ENCOUNTER — Ambulatory Visit: Payer: BLUE CROSS/BLUE SHIELD

## 2023-03-23 NOTE — Progress Notes
Date: 03/28/2023  Name: Adrienne Love   MRN#: [id number]   DOB: 07-30-1970   Age: 52 y.o.  ___________________________________________________________________________________________________________________________________    Dr. Leane Call, DO    Thurmon Fair PA-C    The patient was seen at the [facility] office.     REASON FOR VISIT: Bilateral hips    INTERIM HISTORY: Adrienne Love presents today for orthopedic evaluation of her persistent bilateral hip pain. The patient denies any injury to the bilateral hips, however, notes persistent pain for approximately since 2000. her pain is localized within the posterior hip area bilaterally, can radiate laterally towards the groin, she mostly has more pain posteriorly.  She notes that the previous cortisone injections of both hips improved her pain tremendously well for about 3 months, she is interested in repeating these injections again today.  Her anti-inflammatory meloxicam does not sufficiently improve her pain.    PHYSICAL EXAM:   Vitals:    03/28/23 0739   Weight: 198 lb (89.8 kg)    Body mass index is 32.95 kg/m?Marland Kitchen  Constitutional: Adrienne Love is a 52 y.o. female in no acute distress.   Psyc: The patient is alert and oriented x3 with a normal mood and affect.   HENT: AT/NC; hearing intact   Eyes: PERRLA. Extra-ocular movements are intact.   Skin: Normal temperature. There are no rashes, ulcerations, or lesions.   Lymphatic: No induration or enlargement.   Extremities: No swelling or calf tenderness. clubbing. cyanosis.   Gait:  []   Antalgic [x]    Normal []   Trendelenburg   Assistive devices: none    HIP EXAM   Right Hip Exam:   No surgical incisions or scars are noted.  Balance disturbance: []   Yes [x]  No  Snapping or clicking: []   Yes [x]  No  Erythema present: []   Yes [x]  No   Ecchymosis present []  Yes [x]  No  Swelling present: []  Yes [x]  No  Palpable masses: []  Yes [x]  No   Tenderness to palpation: Palpation is negative unless marked below.   []  Iliac crest  [x]  Greater trochanter- mild  []  Ischium  []  SI joint  []  Pubic Symphysis  []  Log roll  ROM(degrees/pain): There is no pain with ROM unless marked below.  Flexion  110?   []  with pain laterally. []  with pain in the groin.  Extension 0?    []  with pain laterally. []  with pain in the groin.  Abduction 30?  [x]  with pain laterally. [x]  with pain in the posterior hip.  Adduction 20?  []  with pain laterally. []  with pain in the groin.  Internal rotation 25?  []  with pain laterally. []  with pain in the groin.  External rotation 45? []  with pain laterally. []  with pain in the groin.  Motor Strength:   Hip flexion 5/5  Hip extension 5/5   Leg abductors (Superior gluteal (L4-S1)) 5/5  Knee extension (Femoral (L2-L4)) 5/5  Knee Flexion (Sciatic (L3-L4))  5/5  Neurovascular:   Sensation is normal throughout  Upper anterior thigh (L2) Normal  Lateral thigh (L2) Normal  Mid anterior thigh (L3) Normal  Knee, inside of leg (L4) Normal  Dorsum of foot (L5) Normal  Lateral border of foot (S1) Normal  Straight leg test:   []  Positive [x]  Negative []  Not Tested  Vascular   DP: Palpable  PT: Palpable  Special Tests:   Impingement (FADDIR): [x]  Positive []  Negative []  Not Tested  Trendelenburg sign:  []  Positive [x]  Negative []  Not  Tested  Lower back:   There is no tenderness, deformity or injury. All special test are normal.     Left Hip Exam:   No surgical incisions or scars are noted.  Balance disturbance: []  Yes [x]  No  Snapping or clicking: []  Yes [x]  No  Erythema present: []  Yes [x]  No   Ecchymosis present: []  Yes [x]  No  Swelling present: []  Yes [x]  No  Palpable masses: []  Yes [x]  No   Tenderness to palpation: Palpation is negative unless marked below.   []  Iliac crest  [x]  Greater trochanter- mild  []  Ischium  []  SI joint  []  Pubic Symphysis  []  Log roll  ROM(degrees/pain): There is no pain with ROM unless marked below.  Flexion  110?   []  with pain laterally. []  with pain in the groin.  Extension 0?    []  with pain laterally. []  with pain in the groin.  Abduction 30?  []  with pain laterally. []  with pain in the groin.  Adduction 20?  []  with pain laterally. []  with pain in the groin.  Internal rotation 25?  []  with pain laterally. []  with pain in the groin.  External rotation 45? []  with pain laterally. []  with pain in the groin.  Motor Strength:   Hip flexion 5/5  Hip extension 5/5   Leg abductors (Superior gluteal (L4-S1)) 5/5  Knee extension (Femoral (L2-L4)) 5/5  Knee Flexion (Sciatic (L3-L4))  5/5  Neurovascular:   Sensation is normal throughout  Upper anterior thigh (L2) Normal  Lateral thigh (L2) Normal  Mid anterior thigh (L3) Normal  Knee, inside of leg (L4) Normal  Dorsum of foot (L5) Normal  Lateral border of foot (S1) Normal  Straight leg test:   []  Positive [x]  Negative []  Not Tested  Vascular   DP: Palpable  PT: Palpable  Special Tests:   Impingement (FADDIR): [x]  Positive []  Negative []  Not Tested  Trendelenburg sign:  []  Positive [x]  Negative []  Not Tested      Radiographs: Ap pelvis and four views of the bilateral hip were interpreted by me from an orthopedic standpoint here today. They demonstrate mild osteoarthritis with approximately percent joint space narrowing, osteophyte formation, and subchondral sclerosis.     Impression:   Bilateral hip osteoarthritis, possible degenerative labral tearing  Moderate bilateral hip bursitis  Pacemaker/Defibrillator- Dr. Russ Halo- non MRI compatible   Osteoporosis/Osteopenia   Bilateral lumbosacral radiculopathy, L4-L5 radiculopathy  Gastric bypass    Plan: We had a lengthy discussion with the patient today regarding her pain. I discussed with the patient their physical exam findings, radiographs and imaging, above diagnoses, and current treatment options along with their risks and benefits.     The patient's subjective complaints and objective findings are consistent with intra-articular hip pain, she may have worse degenerative osteoarthritis with a x-rays reveal or degenerative labral tearing.     We discussed the treatment options including anti-inflammatory medications, repeat cortisone versus PRP injections, physical therapy, bracing, and surgical interventions. Per the patient, she has undergone multiple bursae injections for the past 5 years with Dr. Vivianne Master. She does finding benefit with these injections for some time.  Previous intra-articular cortisone injections have improved her pain very well and at her request we will repeat these today.  If she fails to have significant long-lasting relief we may consider Zilretta, viscosupplementation, or CT arthrogram.    We will avoid prescribing oral NSAIDs secondary to her gastric bypass.    Following the injections today the patient had  a 85% improvement of her hip pain.    Patient educated on the importance of weight loss through proper, improved nutrition and increased cardiovascular exercise 30-45 minutes for 5-6 days per week. Patient educated on low impact exercises, such as elliptical machine, stationary bike, and aquatic therapy/pool therapy.     Large Joint/Bursa Drain/Inject: bilateral hip joint    Date/Time: 03/28/2023 7:40 AM    Performed by: Thurmon Fair D., PA-C  Authorized by: Thurmon Fair D., PA-C    Consent Given by:  Patient  Site marked: the procedure site was marked    Timeout: prior to procedure the correct patient, procedure, and site was verified    Indications:  Pain and diagnostic evaluation  Location:  Hip  Laterality:  Bilateral  Site:  Bilateral hip joint  Prep: patient was prepped and draped in usual sterile fashion    Approach:  Anterolateral  Guidance: ultrasound    Needle Size:  22 G  Medications (Right):  2 mL lidocaine 1%; 2 mL BUPivacaine 0.25%; 80 mg methylPREDNISolone ACETATE 40 mg/mL  Medications (Left):  2 mL lidocaine 1%; 2 mL BUPivacaine 0.25%; 80 mg methylPREDNISolone ACETATE 40 mg/mL   PROCEDURE: Hip joint injection    Informed Consent: The risks of the hip joint injection was first reviewed with the patient, including infection, bleeding, and nerve damage. The benefits (reduced or eliminated pain, improved stability), adverse effects (soreness, stiffness, temporarily increased pain), and post-injection expectations also were explained.  The patient orally consented to the procedure and understood the risks, benefits, and alternatives.     The hip was prepared in the usual sterile fashion using povidone-iodine and alcohol.  The patient's right hip was evaluated using a Sonosite M Turbo ultrasound machine using a 12 MHz linear probe scanning in the longitudinal and transverse views using both regular B mode as well as Power Doppler. Static and dynamic images were obtained and archived. The ultrasound guidance is medically necessary to confirm needle placement to assure intra-articular injectate is within the joint. The listed needle above with a 10cc syringe was used with the medication listed above to perform an arthrocentesis of the joint which revealed no frank blood, and the medication was then administered intra-articularly easily without resistance.  The site was then cleaned and covered with a bandage after hemostasis was met. The procedure was tolerated well without any complications. The patient was discharged in stable condition with post-injection instructions to avoid standing and walking for very long extended periods of time and high intensity activities.      Follow up: The patient will follow up in 1 month for possible left hip bursae injection.     Thank you for allowing me to participate in Dezmariah Belo Mccombs?s care. Please do not hesitate to contact me if you have any questions or concerns.       Navpreet Szczygiel D. Otilio Miu, PA-C  Physician assistant for Leane Call, D.O.  03/28/2023

## 2023-03-28 ENCOUNTER — Ambulatory Visit: Payer: BLUE CROSS/BLUE SHIELD | Attending: Medical

## 2023-03-28 DIAGNOSIS — M1612 Unilateral primary osteoarthritis, left hip: Secondary | ICD-10-CM

## 2023-03-28 DIAGNOSIS — M1611 Unilateral primary osteoarthritis, right hip: Secondary | ICD-10-CM

## 2023-03-28 MED ADMIN — BUPIVACAINE HCL 0.25 % IJ SOLN: INTRA_ARTICULAR | @ 17:00:00 | Stop: 2023-03-28

## 2023-03-28 MED ADMIN — METHYLPREDNISOLONE ACETATE 40 MG/ML IJ SUSP: INTRA_ARTICULAR | @ 17:00:00 | Stop: 2023-03-28

## 2023-03-28 MED ADMIN — LIDOCAINE HCL 1 % IJ SOLN: INTRA_ARTICULAR | @ 17:00:00 | Stop: 2023-03-28

## 2023-06-13 ENCOUNTER — Ambulatory Visit: Payer: BLUE CROSS/BLUE SHIELD | Attending: Medical

## 2023-06-13 DIAGNOSIS — M1611 Unilateral primary osteoarthritis, right hip: Secondary | ICD-10-CM

## 2023-06-13 DIAGNOSIS — M1612 Unilateral primary osteoarthritis, left hip: Secondary | ICD-10-CM

## 2023-06-13 NOTE — Progress Notes
Date: 06/13/2023  Name: Adrienne Love   MRN#: [id number]   DOB: 05-21-1970   Age: 53 y.o.  ___________________________________________________________________________________________________________________________________    Dr. Leane Call, DO  Thurmon Fair PA-C    The patient was seen at the [facility] office.     REASON FOR VISIT: Bilateral hips    INTERIM HISTORY: Adrienne Love presents today for orthopedic follow up of her persistent bilateral hip pain. The patient received bilateral hip cortisone injections on 03/28/23, which provided 85-90% improvement. This lasted for approximately 2.5 months. Since the last office visit, her symptoms have returned to baseline. The patient is currently under the care of Dr. [name] for her low back complaints.  She is interested in repeating cortisone injections for her hips again.    PHYSICAL EXAM:   BMI 32  Constitutional: Adrienne Love is a 53 y.o. female in no acute distress.   Psyc: The patient is alert and oriented x3 with a normal mood and affect.   HENT: AT/NC; hearing intact   Eyes: PERRLA. Extra-ocular movements are intact.   Skin: Normal temperature. There are no rashes, ulcerations, or lesions.   Lymphatic: No induration or enlargement.   Extremities: No swelling or calf tenderness. clubbing. cyanosis.   Gait:  []   Antalgic [x]    Normal []   Trendelenburg   Assistive devices: none    HIP EXAM   Right Hip Exam:   No surgical incisions or scars are noted.  Balance disturbance: []   Yes [x]  No  Snapping or clicking: []   Yes [x]  No  Erythema present: []   Yes [x]  No   Ecchymosis present []  Yes [x]  No  Swelling present: []  Yes [x]  No  Palpable masses: []  Yes [x]  No   Tenderness to palpation: Palpation is negative unless marked below.   []  Iliac crest  [x]  Greater trochanter- mild  []  Ischium  []  SI joint  []  Pubic Symphysis  []  Log roll  ROM(degrees/pain): There is no pain with ROM unless marked below.  Flexion  110?   []  with pain laterally. []  with pain in the groin.  Extension 0?    []  with pain laterally. []  with pain in the groin.  Abduction 30?  [x]  with pain laterally. [x]  with pain in the posterior hip.  Adduction 20?  []  with pain laterally. []  with pain in the groin.  Internal rotation 25?  []  with pain laterally. []  with pain in the groin.  External rotation 45? []  with pain laterally. []  with pain in the groin.  Motor Strength:   Hip flexion 5/5  Hip extension 5/5   Leg abductors (Superior gluteal (L4-S1)) 5/5  Knee extension (Femoral (L2-L4)) 5/5  Knee Flexion (Sciatic (L3-L4))  5/5  Neurovascular:   Sensation is normal throughout  Upper anterior thigh (L2) Normal  Lateral thigh (L2) Normal  Mid anterior thigh (L3) Normal  Knee, inside of leg (L4) Normal  Dorsum of foot (L5) Normal  Lateral border of foot (S1) Normal  Straight leg test:   []  Positive [x]  Negative []  Not Tested  Vascular   DP: Palpable  PT: Palpable  Special Tests:   Impingement (FADDIR): [x]  Positive []  Negative []  Not Tested  Trendelenburg sign:  []  Positive [x]  Negative []  Not Tested  Lower back:   There is no tenderness, deformity or injury. All special test are normal.   FADDIR and FABER cause low back pain    Left Hip Exam:   No surgical incisions or scars are noted.  Balance disturbance: []  Yes [x]  No  Snapping or clicking: []  Yes [x]  No  Erythema present: []  Yes [x]  No   Ecchymosis present: []  Yes [x]  No  Swelling present: []  Yes [x]  No  Palpable masses: []  Yes [x]  No   Tenderness to palpation: Palpation is negative unless marked below.   []  Iliac crest  [x]  Greater trochanter- mild  []  Ischium  []  SI joint  []  Pubic Symphysis  []  Log roll  ROM(degrees/pain): There is no pain with ROM unless marked below.  Flexion  110?   []  with pain laterally. []  with pain in the groin.  Extension 0?    []  with pain laterally. []  with pain in the groin.  Abduction 30?  []  with pain laterally. []  with pain in the groin.  Adduction 20?  []  with pain laterally. []  with pain in the groin.  Internal rotation 25?  []  with pain laterally. []  with pain in the groin.  External rotation 45? []  with pain laterally. []  with pain in the groin.  Motor Strength:   Hip flexion 5/5  Hip extension 5/5   Leg abductors (Superior gluteal (L4-S1)) 5/5  Knee extension (Femoral (L2-L4)) 5/5  Knee Flexion (Sciatic (L3-L4))  5/5  Neurovascular:   Sensation is normal throughout  Upper anterior thigh (L2) Normal  Lateral thigh (L2) Normal  Mid anterior thigh (L3) Normal  Knee, inside of leg (L4) Normal  Dorsum of foot (L5) Normal  Lateral border of foot (S1) Normal  Straight leg test:   []  Positive [x]  Negative []  Not Tested  Vascular   DP: Palpable  PT: Palpable  Special Tests:   Impingement (FADDIR): [x]  Positive []  Negative []  Not Tested  FABER:   [x]  Positive []  Negative []  Not Tested  Trendelenburg sign:  []  Positive [x]  Negative []  Not Tested      Radiographs: Ap pelvis and four views of the bilateral hip were interpreted by me from an orthopedic standpoint here today. They demonstrate mild osteoarthritis with approximately percent joint space narrowing, osteophyte formation, and subchondral sclerosis.     Impression:   Bilateral hip osteoarthritis, possible degenerative labral tearing  Moderate bilateral hip bursitis  Pacemaker/Defibrillator- Dr. Russ Halo- non MRI compatible   Osteoporosis/Osteopenia - Prolia injection  Bilateral lumbosacral radiculopathy, L4-L5 radiculopathy  Gastric bypass    Plan: We had a lengthy discussion with the patient today regarding her pain. I discussed with the patient their physical exam findings, radiographs and imaging, above diagnoses, and current treatment options along with their risks and benefits.     THE PATIENT HAS FAILED THE FOLLOWING PRIOR TREATMENT:  []  No previous treatment   [x]  Ice/Heat   []  Cane/Walker  [x]  NSAID   Meloxicam 7.5 mg once daily and Topical Voltaren gel (1% Diclofenac)   []  Physical therapy   [x]  Cortisone injection   Methylprednisolone  []  Viscous-supplementation injection     []  Bracing     []  Narcotic medication    The patient's subjective complaints and objective findings are consistent with intra-articular hip pain, she may have worse degenerative osteoarthritis with a x-rays reveal or degenerative labral tearing. I discussed a possible     The risks and benefits of a cortisone injection for the Right and Left hip were discussed with the patient and they would like to proceed. At their request, we will proceed as the previous injection did improve their pain significantly and kept them quite functional.  We will try kenalog injections today as an attempt  to provide longer-lasting relief.  Following the injections today she stated that her pain was much improved.    The patient is instructed to use ice, heat, and OTC pain medications as necessary to control any pain or swelling. If the patient notes any increased pain, swelling, bleeding or other abnormalities as discussed today they should contact the office immediately. If she fails to have significant long-lasting relief we may consider Zilretta, viscosupplementation, or CT arthrogram.    We will avoid prescribing oral NSAIDs secondary to her gastric bypass.    Patient educated on the importance of weight loss through proper, improved nutrition and increased cardiovascular exercise 30-45 minutes for 5-6 days per week. Patient educated on low impact exercises, such as elliptical machine, stationary bike, and aquatic therapy/pool therapy.     Large Joint/Bursa Drain/Inject: bilateral hip joint    Date/Time: 06/13/2023 3:50 PM    Performed by: Thurmon Fair D., PA-C  Authorized by: Thurmon Fair D., PA-C    Consent Given by:  Patient  Site marked: the procedure site was marked    Timeout: prior to procedure the correct patient, procedure, and site was verified    Indications:  Pain and diagnostic evaluation  Location:  Hip  Laterality:  Bilateral  Site:  Bilateral hip joint  Prep: patient was prepped and draped in usual sterile fashion    Approach:  Anterolateral  Guidance: ultrasound    Needle Size:  22 G  Medications (Right):  2 mL lidocaine 1%; 2 mL BUPivacaine 0.25%; 80 mg triamcinolone acetonide 40 mg/mL  Medications (Left):  2 mL lidocaine 1%; 2 mL BUPivacaine 0.25%; 80 mg triamcinolone acetonide 40 mg/mL   PROCEDURE: Hip joint injection    Informed Consent: The risks of the hip joint injection was first reviewed with the patient, including infection, bleeding, and nerve damage. The benefits (reduced or eliminated pain, improved stability), adverse effects (soreness, stiffness, temporarily increased pain), and post-injection expectations also were explained.  The patient orally consented to the procedure and understood the risks, benefits, and alternatives.     The hip was prepared in the usual sterile fashion using povidone-iodine and alcohol.  The patient's right hip was evaluated using a Sonosite M Turbo ultrasound machine using a 12 MHz linear probe scanning in the longitudinal and transverse views using both regular B mode as well as Power Doppler. Static and dynamic images were obtained and archived. The ultrasound guidance is medically necessary to confirm needle placement to assure intra-articular injectate is within the joint. The listed needle above with a 10cc syringe was used with the medication listed above to perform an arthrocentesis of the joint which revealed no frank blood, and the medication was then administered intra-articularly easily without resistance.  The site was then cleaned and covered with a bandage after hemostasis was met. The procedure was tolerated well without any complications. The patient was discharged in stable condition with post-injection instructions to avoid standing and walking for very long extended periods of time and high intensity activities.    Follow up: 3 months    Thank you for allowing me to participate in Jaylianna Tatlock Vancamp?s care. Please do not hesitate to contact me if you have any questions or concerns.       Lita Flynn D. Otilio Miu, PA-C  Physician assistant for Leane Call, D.O.  06/14/2023

## 2023-06-14 MED ADMIN — BUPIVACAINE HCL 0.25 % IJ SOLN: INTRA_ARTICULAR | @ 21:00:00 | Stop: 2023-06-13

## 2023-06-14 MED ADMIN — TRIAMCINOLONE ACETONIDE 40 MG/ML IJ SUSP: INTRA_ARTICULAR | @ 21:00:00 | Stop: 2023-06-13

## 2023-06-14 MED ADMIN — LIDOCAINE HCL 1 % IJ SOLN: INTRA_ARTICULAR | @ 21:00:00 | Stop: 2023-06-13

## 2023-06-27 ENCOUNTER — Ambulatory Visit: Payer: BLUE CROSS/BLUE SHIELD

## 2023-06-28 ENCOUNTER — Ambulatory Visit: Payer: BLUE CROSS/BLUE SHIELD | Attending: Medical

## 2023-08-03 NOTE — Progress Notes
 Date: 08/22/2023  Name: Adrienne Love   MRN#: [id number]   DOB: Sep 25, 1970   Age: 53 y.o.  ___________________________________________________________________________________________________________________________________    Dr. Leane Call, DO    The patient was seen at the [facility] office.    REASON FOR VISIT: Left knee    INTERIM HISTORY: The patient is a 53 y.o. female that comes to our office for orthopaedic evaluation of her ongoing symptoms. The patient says that the pain is dull to sharp and rates the severity as {HH 0-10:109015}/10. The pain is located {Blank single:19197::''diffusely'',''laterally'',''medially''} about the knee without radiation. The pain is aggravated by activity and improves with rest. The patient has difficulties sitting, standing, and walking for long periods of time. The pain is interfering with activities of daily living. The patient {Blank single:19197::''affirms'',''denies''} associated clicking and popping of the knee and {Blank single:19197::''affirms'',''denies''} instability with walking. Overall,her symptoms have ***.     PRIOR TREATMENT HAS INCLUDED:   [x]   No previous treatment   []   Ice/Heat   []   NSAID   []   Physical therapy   []   Cortisone injection   []   Viscous-supplementation injections  []   Bracing  []   Pain meds     Past Medical History:   Past Medical History:   Diagnosis Date    Anemia     Arthritis     Asthma     Cancer (HCC/RAF)     breast    GERD (gastroesophageal reflux disease)     Heart disease     Hypertension     Pacemaker 2008       Past Surgical History:   Past Surgical History:   Procedure Laterality Date    BREAST LUMPECTOMY Right     CARDIAC PACEMAKER PLACEMENT      CHOLECYSTECTOMY  2000    foot realignment Left 2014    GASTRIC BYPASS  2000    HYSTERECTOMY  2010    INSERT / REPLACE / REMOVE PACEMAKER  2020    REDUCTION MAMMAPLASTY  2010    VASCULAR SURGERY  2023       Medications:   Current Outpatient Medications   Medication Sig    diclofenac Sodium 1% gel Apply 4 g topically four (4) times daily To affected area..    flecainide 100 mg tablet Take 1 tablet (100 mg total) by mouth two (2) times daily.    Levalbuterol HCl (XOPENEX IN) Inhale as needed for.    levocetirizine 5 mg tablet Take 1 tablet (5 mg total) by mouth daily.    lidocaine 5% patch Apply 1 patch to affected area PRN pain, use 12 hours on then 12 hours off. No more than 1 patch a day.    meloxicam 7.5 mg tablet Take 1 tablet (7.5 mg total) by mouth daily.    Metoprolol Succinate 50 MG CS24 50 mg.    metoprolol tartrate 50 mg tablet Take 1 tablet (50 mg total) by mouth two (2) times daily.    omeprazole 20 mg DR capsule Take 1 capsule (20 mg total) by mouth daily.     No current facility-administered medications for this visit.       Allergies:   Allergies   Allergen Reactions    Morphine      Other Reaction(s): Abdominal pain    Patient Reported Allergy via Notable    Bacitracin-Polymyxin B     Neomycin-Polymyxin-Hc Unknown (Patient not available)     Transcribing historical allergy; verify with patient when able.  Povidone Iodine     Iodine Rash       Social history:   Social History     Tobacco Use    Smoking status: Former     Current packs/day: 0.00     Average packs/day: 1 pack/day for 3.0 years (3.0 ttl pk-yrs)     Types: Cigarettes     Start date: 05/03/1983     Quit date: 05/10/1986     Years since quitting: 37.2    Smokeless tobacco: Never   Substance Use Topics    Alcohol use: Yes     Alcohol/week: 1.8 oz     Types: 3 Shots of Liquor (1.5 oz) per week    Drug use: Never       Family history:   Family History   Problem Relation Age of Onset    Arthritis Mother     Cancer Father         Colon cancer       REVIEW OF SYSTEMS: The 14-point review of systems as documented today in the medical record is remarkable for the positive orthopedic problems discussed above and their relevance was considered with respect to Constitutional, ENT, Cardiovascular, GU, Skin, Neurologic, Endocrine, Hematologic, Psychiatric, Gastrointestinal, Respiratory, Eyes and Allergic/Immunologic systems.      PHYSICAL EXAM:   Constitutional: Adrienne Love is a 53 y.o. female in no acute distress.   There were no vitals filed for this visit. There is no height or weight on file to calculate BMI.  Psyc: The patient is alert and oriented x3 with a normal mood and affect.   HENT: AT/NC; hearing intact   Eyes: PERRLA. Extra-ocular movements are intact.   Skin: Normal temperature. There are no rashes, ulcerations, or lesions.   Lymphatic: No induration or enlargement.   Extremities: No swelling or calf tenderness. clubbing. Cyanosis.  Gait:  []  Antalgic [x]  Normal []  Trendelenburg   Assistive devices: {Assistive Devices [id number]     KNEE EXAM  Bilateral knees: The patient is grossly neurologically intact from L2-S1. 2+ deep tendon reflexes of the patellofemoral and achilles tendons. 2+ posterior tibialis and dorsalis pedis pulses. No lymphedema.  Skin has no lesions. Compartments are soft.     Right Knee Exam:   {Blank single:19197::''A well healed midline incision is noted.'',''Well healed surgical incisions are noted.'',''Healed mini posterior incision is noted.'',''Surgical scars are noted.'',''No surgical incisions or scars are noted.''}  Erythema present:                  []  Yes [x]  No   Bruising or ecchymosis:          []  Yes [x]  No   Palpable masses present:      []  Yes [x]  No   [x]  No effusion present   []  Small Effusion present []  Moderate Effusion present      []  Large effusion  present  Palpation tenderness severity: []  None   [x]  Mild   []  Moderate    []  Severe  Palpable tenderness location:              []  N/A              [x]  Medial joint line              []  Lateral joint line               []  Posteromedial joint line              []   Posterolateral joint line              []  Posterior knee              []  Patellofemoral joint line              []  Diffusely               []  Quadriceps tendon              []  Patellar tendon              []  Pes anserine bursa              []  Patella              []  Diffusely        Range of motion is 0? extension to 115? flexion  Guarding and crepitus:           []  Yes [x]  No   Flexion contractures:              []  Yes [x]  No   The knee is {Blank single:20442::''stable'',''unstable''} with {Blank single:20442::''no'',''1+'',''2+'',''3+''} laxity with {Blank single:20442::''Varus and Valgus'',''Varus'',''Valgus''} stress at 0, 30, 90?Aaron Aas      Special tests:   McMurray's test:   []  Positive [x]  Negative []  Not Tested  Reverse McMurray's test: []  Positive [x]  Negative []  Not Tested  Apley's test:   []  Positive [x]  Negative []  Not Tested  Lachman's test:  []  Positive [x]  Negative []  Not Tested  Anterior drawer test:  []  Positive [x]  Negative []  Not Tested  Posterior drawer test:  []  Positive [x]  Negative []  Not Tested  Patellar apprehension test: []  Positive [x]  Negative []  Not Tested  Patellar grind test:  []  Positive [x]  Negative []  Not Tested  Muscle Strength of the knee is 5/5. Patellar tracking is normal    Left Knee Exam:   {Blank single:19197::''A well healed midline incision is noted.'',''Well healed surgical incisions are noted.'',''Healed mini posterior incision is noted.'',''Surgical scars are noted.'',''No surgical incisions or scars are noted.''}  Erythema present:                  []  Yes [x]  No   Bruising or ecchymosis:          []  Yes [x]  No   Palpable masses present:      []  Yes [x]  No   [x]  No effusion present   []  Small Effusion present []  Moderate Effusion present      []  Large effusion  present  Palpation tenderness severity: []  None   [x]  Mild   []  Moderate    []  Severe  Palpable tenderness location:              []  N/A              [x]  Medial joint line              []  Lateral joint line               []  Posteromedial joint line              []  Posterolateral joint line              []  Posterior knee              []  Patellofemoral joint line              []  Diffusely               []   Quadriceps tendon              []  Patellar tendon []  Pes anserine bursa              []  Patella              []  Diffusely        Range of motion is 0? extension to 115? flexion  Guarding and crepitus:           []  Yes [x]  No   Flexion contractures:              []  Yes [x]  No   The knee is {Blank single:20442::''stable'',''unstable''} with {Blank single:20442::''no'',''1+'',''2+'',''3+''} laxity with {Blank single:20442::''Varus and Valgus'',''Varus'',''Valgus''} stress at 0, 30, 90?Aaron Aas     Special tests:   McMurray's test:   []  Positive [x]  Negative []  Not Tested  Reverse McMurray's test: []  Positive [x]  Negative []  Not Tested  Apley's test:   []  Positive [x]  Negative []  Not Tested  Lachman's test:  []  Positive [x]  Negative []  Not Tested  Anterior drawer test:  []  Positive [x]  Negative []  Not Tested  Posterior drawer test:  []  Positive [x]  Negative []  Not Tested  Patellar apprehension test: []  Positive [x]  Negative []  Not Tested  Patellar grind test:  []  Positive [x]  Negative []  Not Tested  Muscle Strength of the knee is 5/5. Patellar tracking is normal    HIP EXAM   Right Hip Exam:   {Blank single:19197::''A well healed midline incision is noted.'',''Well healed surgical incisions are noted.'',''Healed mini posterior incision is noted.'',''Surgical scars are noted.'',''No surgical incisions or scars are noted.''}  Balance disturbance: []  Yes [x]  No  Snapping or clicking: []  Yes [x]  No  Erythema present: []  Yes [x]  No   Ecchymosis present []  Yes [x]  No  Swelling present: []  Yes [x]  No  Palpable masses: []  Yes [x]  No   Tenderness to palpation: Palpation is negative unless marked below.   []  Iliac crest  []  Greater trochanter  []  Ischium  []  SI joint  []  Pubic Symphysis  []  Log roll  ROM(degrees/pain): There is no pain with ROM unless marked below.  Flexion  110?   []  with pain laterally. []  with pain in the groin.  Extension 0?    []  with pain laterally. []  with pain in the groin.  Abduction 30?  []  with pain laterally. []  with pain in the groin.  Adduction 20?  []  with pain laterally. []  with pain in the groin.  Internal rotation {Blank single:19197::''15'',''25''}?  []  with pain laterally. []  with pain in the groin.  External rotation {Blank single:19197::''35'',''45''}? []  with pain laterally. []  with pain in the groin.  Motor Strength:   Hip flexion 5/5  Hip extension 5/5   Leg abductors (Superior gluteal (L4-S1)) 5/5  Knee extension (Femoral (L2-L4)) 5/5  Knee Flexion (Sciatic (L3-L4))  5/5  Neurovascular:   Sensation is normal throughout  Upper anterior thigh (L2) Normal  Lateral thigh (L2) Normal  Mid anterior thigh (L3) Normal  Knee, inside of leg (L4) Normal  Dorsum of foot (L5) Normal  Lateral border of foot (S1) Normal  Straight leg test:   []  Positive [x]  Negative []  Not Tested  Vascular   DP: Palpable  PT: Palpable  Special Tests:   Impingement (FADDIR): []  Positive [x]  Negative []  Not Tested  Trendelenburg sign:  []  Positive [x]  Negative []  Not Tested  Lower back:   There is no tenderness, deformity or injury. All special test  are normal.     Left Hip Exam:   {Blank single:19197::''A well healed midline incision is noted.'',''Well healed surgical incisions are noted.'',''Healed mini posterior incision is noted.'',''Surgical scars are noted.'',''No surgical incisions or scars are noted.''}  Balance disturbance: []  Yes [x]  No  Snapping or clicking: []  Yes [x]  No  Erythema present: []  Yes [x]  No   Ecchymosis present: []  Yes [x]  No  Swelling present: []  Yes [x]  No  Palpable masses: []  Yes [x]  No   Tenderness to palpation: Palpation is negative unless marked below.   []  Iliac crest  []  Greater trochanter  []  Ischium  []  SI joint  []  Pubic Symphysis  []  Log roll  ROM(degrees/pain): There is no pain with ROM unless marked below.  Flexion  110?   []  with pain laterally. []  with pain in the groin.  Extension 0?    []  with pain laterally. []  with pain in the groin.  Abduction 30?  []  with pain laterally. []  with pain in the groin.  Adduction 20?  []  with pain laterally. []  with pain in the groin.  Internal rotation {Blank single:19197::''15'',''25''}?  []  with pain laterally. []  with pain in the groin.  External rotation {Blank single:19197::''35'',''45''}? []  with pain laterally. []  with pain in the groin.  Motor Strength:   Hip flexion 5/5  Hip extension 5/5   Leg abductors (Superior gluteal (L4-S1)) 5/5  Knee extension (Femoral (L2-L4)) 5/5  Knee Flexion (Sciatic (L3-L4))  5/5  Neurovascular:   Sensation is normal throughout  Upper anterior thigh (L2) Normal  Lateral thigh (L2) Normal  Mid anterior thigh (L3) Normal  Knee, inside of leg (L4) Normal  Dorsum of foot (L5) Normal  Lateral border of foot (S1) Normal  Straight leg test:   []  Positive [x]  Negative []  Not Tested  Vascular   DP: Palpable  PT: Palpable  Special Tests:   Impingement (FADDIR): []  Positive [x]  Negative []  Not Tested  Trendelenburg sign:  []  Positive [x]  Negative []  Not Tested       RADIOGRAPHS: 4 views of left knee and 3 view(s) comparison of the opposite knee were ordered, obtained and reviewed by me here at [facility] today and reviewed with the patient. They show {Desc; mild/moderate/severe:10411} osteoarthritis with {Medial/intermediate/lateral:30267} joint space narrowing, subchondral sclerosis, and periarticular spurring.     IMPRESSION: Left knee osteoarthritis     PLAN: We had a lengthy discussion with the patient today regarding her pain.  At this point I would like to start the patient on Mobic 15 mg p.o. daily with food and to stop all other anti-inflammatories.  I would also like to inject the left knee today with a corticosteroid to try to provide maximal immediate pain relief.  Hopefully this provides lasting relief.  If it does not we will consider Visco supplementation at the next visit.    Patient educated on the importance of weight loss through proper, improved nutrition and increased cardiovascular exercise 30-45 minutes for 5-6 days per week. Patient educated on low impact exercises, such as elliptical machine, stationary bike, and aquatic therapy/pool therapy.     NEXT APPOINTMENT:  3 months    Thank you for allowing me to participate in Nguyen Butler Lazaro?s care. Please do not hesitate to contact me if you have any questions or concerns.         Jaymes Mew, D.O.  Adult Reconstruction Specialist  ORTHOPEDIC SURGERY  08/03/2023

## 2023-08-09 NOTE — Progress Notes
 Date: 08/15/2023  Name: Adrienne Love   MRN#: [id number]   DOB: [birth year]   Age: 53 y.o.  ___________________________________________________________________________________________________________________________________    Dr. Jaymes Mew, DO    CHIEF COMPLAINT: Status post Right Total knee arthroplasty DOS 12/21/2022     INTERIM HISTORY: ENDORA TERESI presents approximately 8 months post operatively from right total knee replacement. Previous knee pain is significantly improved and today is rated a 2/10 in severity. The knee pain is dull in quality, located diffusely about the knee. The patient is quite pleased and is able to perform all her activities of daily living with much more comfort. Overall, she feels at least 80% improvement with her right knee.     Currently, the preponderance of her complaints are emanating from her left knee. She is currently tolerating her bilateral hip pain well at this time.     PHYSICAL EXAM:  Constitutional: CONI HOMESLEY is a 53 y.o. female in no acute distress.   Vitals:    08/15/23 0844   Weight: 176 lb (79.8 kg)   Height: 5' 5'' (1.651 m)    Body mass index is 29.29 kg/m?Aaron Aas  Psyc: The patient is alert and oriented x3 with a normal mood and affect.  HENT: AT/NC; hearing intact  Eyes: PERRLA. Extra-ocular movements are intact.  Skin: Normal temperature. There are no rashes, ulcerations, or lesions.  Lymphatic: No induration or enlargement.  Extremities: No swelling or calf tenderness. No clubbing. No cyanosis.  Gait:  []  Antalgic [x]  Normal []  Trendelenburg   Assistive devices: none    Right knee: Patient?s incision is well healed. There is no erythema present. No bruising or ecchymosis. There is minimal swelling.  No effusion is present. Palpation of the knee reveals No tenderness. There is no calf tenderness with palpation. Negative Homan's sign.    Range of motion is 0? extension to 120? flexion with no pain and no instability. Knee is stable to varus/valgus stress at 0,30, and 90 degree. Normal patellar tracking. Muscle Strength of the knee is 5/5 with respect to flexors and extensors.    Left  Knee Exam:    Visual:  No surgical incisions or scars are noted.  Erythema present:  []  Yes [x]  No   Bruising or ecchymosis: []  Yes [x]  No   Recurvatum and valgus deformity is noted.     Palpation:   Palpable masses present: []  Yes [x]  No   [x]  No effusion present   []  Small Effusion present []  Moderate Effusion present  []  Large effusion  present  Palpation reveals mild tenderness over the medial joint line and knee diffusely    ROM:   Range of motion is 0? extension to 120? flexion with no pain. There is no crepitus with ROM.   Guarding:   []  Yes [x]  No   The knee is stable with 1+ laxity with Varus stress at 10, 30, 90?. Valgus deformity is noted.     Strength:  Muscle Strength of the knee flexors and extensors is 5/5. Patellar tracking is normal.     Special tests:   McMurray's test:   []  Positive [x]  Negative []  Not Tested  Reverse McMurray's test: []  Positive [x]  Negative []  Not Tested  Anterior drawer test:  []  Positive [x]  Negative []  Not Tested  Posterior drawer test:  []  Positive [x]  Negative []  Not Tested  Patellar apprehension test: []  Positive [x]  Negative []  Not Tested  Patellar grind test:  []  Positive [x]  Negative []  Not Tested  X-RAYS: Four views of the right knee and 3 view(s) comparison of the opposite knee were ordered, obtained and reviewed by me here at [facility] today and reviewed with the patient. Radiographs demonstrate a TKA in good position and alignment.  There is optimal implant cement-bone interface without radiolucencies. There is no evidence of loosening or other abnormalities. The patella is well located.    IMPRESSION:   Status post Right Total knee arthroplasty DOS 12/21/2022  Left knee OA  Moderate bilateral hip bursitis- tolerable today  Pacemaker/Defibrillator- Dr. 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 The patient is to have activities as tolerated without any restrictions. They were instructed to continue their daily ROM and strengthening exercises. The patient is to continue her anti-inflammatory as needed.     Again, her subjective complaints and objective findings are consistent with osteoarthritis of the left knee. However, the patient is tolerating her knee pain well . At this time we will continue with observation, per the patient's request. The patient was encouraged to continue with their exercises at home. All of the patient's questions and concerns were addressed.     Lastly, we will request labwork to assess for inflammatory markers and autoimmune disorders.     FOLLOW UP: Return to the [facility] clinic in three to five years for follow-up and repeat x-rays of right knee protocol.F/U in 3 months for the left knee with labwork.     Thank you for allowing me to participate in Junko Ohagan Rotenberry?s care. Please do not hesitate to contact me if you have any questions or concerns.         Jaymes Mew, D.O.  Adult Reconstruction Specialist  ORTHOPEDIC SURGERY  08/15/2023

## 2023-08-15 ENCOUNTER — Ambulatory Visit: Payer: BLUE CROSS/BLUE SHIELD

## 2023-08-15 DIAGNOSIS — M25561 Pain in right knee: Secondary | ICD-10-CM

## 2023-08-18 ENCOUNTER — Ambulatory Visit: Payer: BLUE CROSS/BLUE SHIELD | Attending: Medical

## 2023-08-22 ENCOUNTER — Ambulatory Visit: Payer: BLUE CROSS/BLUE SHIELD

## 2023-08-23 ENCOUNTER — Ambulatory Visit: Payer: BLUE CROSS/BLUE SHIELD

## 2023-09-12 ENCOUNTER — Ambulatory Visit: Payer: BLUE CROSS/BLUE SHIELD | Attending: Medical

## 2023-09-14 ENCOUNTER — Other Ambulatory Visit: Payer: BLUE CROSS/BLUE SHIELD

## 2023-09-14 NOTE — Telephone Encounter
Please advise labs in chart

## 2023-09-14 NOTE — Progress Notes
 Date: 09/26/2023  Name: SAMREEN SELTZER   MRN#: [id number]   DOB: 11-11-1970   Age: 53 y.o.  ___________________________________________________________________________________________________________________________________    Dr. Jaymes Mew, DO  Jule Nyhan PA-C    The patient was seen at the [facility] office.     REASON FOR VISIT: Bilateral hips    INTERIM HISTORY: DELAINY MCELHINEY presents today for orthopedic follow up of her persistent bilateral hip pain. She localizes her pain over the lateral hips with radiation into her lower back. The patient received bilateral hip joint cortisone injections on 06/13/23, which provided 90-95% improvement. This lasted for approximately 2 and half months. Since the last office visit, her symptoms have returned, again her pain is similar without any change.  She is requesting repeat injections again today.    PHYSICAL EXAM:   BMI 32  Constitutional: ASMARA BACKS is a 53 y.o. female in no acute distress.   Psyc: The patient is alert and oriented x3 with a normal mood and affect.   HENT: AT/NC; hearing intact   Eyes: PERRLA. Extra-ocular movements are intact.   Skin: Normal temperature. There are no rashes, ulcerations, or lesions.   Lymphatic: No induration or enlargement.   Extremities: No swelling or calf tenderness. clubbing. cyanosis.   Gait:  []   Antalgic [x]    Normal []   Trendelenburg   Assistive devices: none    HIP EXAM   Right Hip Exam:   No surgical incisions or scars are noted.  Balance disturbance: []   Yes [x]  No  Snapping or clicking: []   Yes [x]  No  Erythema present: []   Yes [x]  No   Ecchymosis present []  Yes [x]  No  Swelling present: []  Yes [x]  No  Palpable masses: []  Yes [x]  No   Tenderness to palpation: Palpation is negative unless marked below.   []  Iliac crest  [x]  Greater trochanter- mild  []  Ischium  []  SI joint  []  Pubic Symphysis  []  Log roll  ROM(degrees/pain): There is no pain with ROM unless marked below.  Flexion  110?   []  with pain laterally. []  with pain in the groin.  Extension 0?    []  with pain laterally. []  with pain in the groin.  Abduction 30?  [x]  with pain laterally. [x]  with pain in the posterior hip.  Adduction 20?  []  with pain laterally. []  with pain in the groin.  Internal rotation 25?  []  with pain laterally. []  with pain in the groin.  External rotation 45? []  with pain laterally. []  with pain in the groin.  Motor Strength:   Hip flexion 5/5  Hip extension 5/5   Leg abductors (Superior gluteal (L4-S1)) 5/5  Knee extension (Femoral (L2-L4)) 5/5  Knee Flexion (Sciatic (L3-L4))  5/5  Neurovascular:   Sensation is normal throughout  Upper anterior thigh (L2) Normal  Lateral thigh (L2) Normal  Mid anterior thigh (L3) Normal  Knee, inside of leg (L4) Normal  Dorsum of foot (L5) Normal  Lateral border of foot (S1) Normal  Straight leg test:   []  Positive [x]  Negative []  Not Tested  Vascular   DP: Palpable  PT: Palpable  Special Tests:   Impingement (FADDIR): [x]  Positive []  Negative []  Not Tested  Trendelenburg sign:  []  Positive [x]  Negative []  Not Tested  Lower back:   There is no tenderness, deformity or injury. All special test are normal.   FADDIR and FABER cause low back pain    Left Hip Exam:   No surgical incisions  or scars are noted.  Balance disturbance: []  Yes [x]  No  Snapping or clicking: []  Yes [x]  No  Erythema present: []  Yes [x]  No   Ecchymosis present: []  Yes [x]  No  Swelling present: []  Yes [x]  No  Palpable masses: []  Yes [x]  No   Tenderness to palpation: Palpation is negative unless marked below.   []  Iliac crest  [x]  Greater trochanter- mild  []  Ischium  []  SI joint  []  Pubic Symphysis  []  Log roll  ROM(degrees/pain): There is no pain with ROM unless marked below.  Flexion  110?   []  with pain laterally. []  with pain in the groin.  Extension 0?    []  with pain laterally. []  with pain in the groin.  Abduction 30?  []  with pain laterally. []  with pain in the groin.  Adduction 20?  []  with pain laterally. []  with pain in the groin.  Internal rotation 25?  []  with pain laterally. []  with pain in the groin.  External rotation 45? []  with pain laterally. []  with pain in the groin.  Motor Strength:   Hip flexion 5/5  Hip extension 5/5   Leg abductors (Superior gluteal (L4-S1)) 5/5  Knee extension (Femoral (L2-L4)) 5/5  Knee Flexion (Sciatic (L3-L4))  5/5  Neurovascular:   Sensation is normal throughout  Upper anterior thigh (L2) Normal  Lateral thigh (L2) Normal  Mid anterior thigh (L3) Normal  Knee, inside of leg (L4) Normal  Dorsum of foot (L5) Normal  Lateral border of foot (S1) Normal  Straight leg test:   []  Positive [x]  Negative []  Not Tested  Vascular   DP: Palpable  PT: Palpable  Special Tests:   Impingement (FADDIR): [x]  Positive []  Negative []  Not Tested  FABER:   [x]  Positive []  Negative []  Not Tested  Trendelenburg sign:  []  Positive [x]  Negative []  Not Tested      Radiographs: Ap pelvis and four views of the bilateral hip were ordered, obtained and interpreted by me from an orthopedic standpoint here today. They demonstrate moderate osteoarthritis with approximately percent joint space narrowing, osteophyte formation, and subchondral sclerosis.     Impression:   Bilateral hip osteoarthritis, possible degenerative labral tearing  Moderate bilateral hip bursitis  Pacemaker/Defibrillator- Dr. Zita Hind- non MRI compatible   Osteoporosis/Osteopenia - Prolia injection  Bilateral lumbosacral radiculopathy, L4-L5 radiculopathy  Gastric bypass    Plan: We had a lengthy discussion with the patient today regarding her pain. I discussed with the patient their physical exam findings, radiographs and imaging, above diagnoses, and current treatment options along with their risks and benefits.     THE PATIENT HAS FAILED THE FOLLOWING PRIOR TREATMENT:  []  No previous treatment   [x]  Ice/Heat   []  Cane/Walker  [x]  NSAID   Meloxicam 7.5 mg once daily and Topical Voltaren gel (1% Diclofenac)   []  Physical therapy   [x]  Cortisone injection   Methylprednisolone  []  Viscous-supplementation injection     []  Bracing     []  Narcotic medication    The patient's subjective complaints and objective findings are consistent with intra-articular hip pain, she may have worse degenerative osteoarthritis with a x-rays reveal or degenerative labral tearing.  However we also discussed that there could be a likelihood that her lower back could be the cause of her pain, and cortisone injections may simply help alleviate pain systemically.  She was recommended to follow up with her lumbar spine specialist, and if after completion of physical therapy could consider diagnostic lumbar spine  injection to rule out pain emanating from her lumbar spine.  The patient we will consider this.    The risks and benefits of a cortisone injection for the Right and Left hip were discussed with the patient and they would like to proceed. At their request, we will proceed as the previous injection did improve their pain significantly and kept them quite functional.  We will try kenalog injections today as an attempt to provide longer-lasting relief.  Following the injections today she stated that her pain was improved by 100%.    The patient is instructed to use ice, heat, and OTC pain medications as necessary to control any pain or swelling. If the patient notes any increased pain, swelling, bleeding or other abnormalities as discussed today they should contact the office immediately. If she fails to have significant long-lasting relief we may consider Zilretta, viscosupplementation, or CT arthrogram.    We will avoid prescribing oral NSAIDs secondary to her gastric bypass.    Follow up: 3 months    Large Joint/Bursa Drain/Inject: bilateral hip joint    Date/Time: 09/26/2023 7:30 AM    Performed by: Jule Nyhan D., PA-C  Authorized by: Jule Nyhan D., PA-C    Consent Given by:  Patient  Site marked: the procedure site was marked    Timeout: prior to procedure the correct patient, procedure, and site was verified    Indications:  Pain and diagnostic evaluation  Location:  Hip  Laterality:  Bilateral  Site:  Bilateral hip joint  Prep: patient was prepped and draped in usual sterile fashion    Approach:  Anterolateral  Guidance: ultrasound    Needle Size:  22 G  Medications (Right):  80 mg methylPREDNISolone ACETATE 40 mg/mL  Medications (Left):  80 mg methylPREDNISolone ACETATE 40 mg/mL   PROCEDURE: Hip joint injection    Informed Consent: The risks of the hip joint injection was first reviewed with the patient, including infection, bleeding, and nerve damage. The benefits (reduced or eliminated pain, improved stability), adverse effects (soreness, stiffness, temporarily increased pain), and post-injection expectations also were explained.  The patient orally consented to the procedure and understood the risks, benefits, and alternatives.     The hip was prepared in the usual sterile fashion using povidone-iodine and alcohol.  The patient's right hip was evaluated using a Sonosite M Turbo ultrasound machine using a 12 MHz linear probe scanning in the longitudinal and transverse views using both regular B mode as well as Power Doppler. Static and dynamic images were obtained and archived. The ultrasound guidance is medically necessary to confirm needle placement to assure intra-articular injectate is within the joint. The listed needle above with a 10cc syringe was used with the medication listed above to perform an arthrocentesis of the joint which revealed no frank blood, and the medication was then administered intra-articularly easily without resistance.  The site was then cleaned and covered with a bandage after hemostasis was met. The procedure was tolerated well without any complications. The patient was discharged in stable condition with post-injection instructions to avoid standing and walking for very long extended periods of time and high intensity activities.        Thank you for allowing me to participate in Nallely Yost Caton?s care. Please do not hesitate to contact me if you have any questions or concerns.       Leylanie Woodmansee D. Kelli Pates, PA-C  Physician assistant for Jaymes Mew, D.O.  09/14/2023

## 2023-09-22 ENCOUNTER — Ambulatory Visit: Payer: BLUE CROSS/BLUE SHIELD

## 2023-09-22 DIAGNOSIS — M25552 Pain in left hip: Secondary | ICD-10-CM

## 2023-09-22 DIAGNOSIS — M25551 Pain in right hip: Secondary | ICD-10-CM

## 2023-09-26 ENCOUNTER — Ambulatory Visit: Payer: PRIVATE HEALTH INSURANCE | Attending: Medical

## 2023-09-26 DIAGNOSIS — M25551 Pain in right hip: Secondary | ICD-10-CM

## 2023-09-26 DIAGNOSIS — M25552 Pain in left hip: Secondary | ICD-10-CM

## 2023-09-26 DIAGNOSIS — M1611 Unilateral primary osteoarthritis, right hip: Secondary | ICD-10-CM

## 2023-09-26 DIAGNOSIS — M1612 Unilateral primary osteoarthritis, left hip: Secondary | ICD-10-CM

## 2023-09-26 MED ADMIN — METHYLPREDNISOLONE ACETATE 40 MG/ML IJ SUSP: INTRA_ARTICULAR | @ 19:00:00 | Stop: 2023-09-26 | NDC 16714009001

## 2023-11-17 ENCOUNTER — Ambulatory Visit: Payer: PRIVATE HEALTH INSURANCE

## 2023-12-18 ENCOUNTER — Ambulatory Visit: Payer: BLUE CROSS/BLUE SHIELD | Attending: Rheumatology

## 2023-12-25 NOTE — Progress Notes
 Date: 12/29/2023  Name: Adrienne Love   MRN#: [id number]   DOB: 1970/05/27   Age: 53 y.o.  ___________________________________________________________________________________________________________________________________    Dr. Shlomo Sang, DO  Rozelle Boas PA-C    The patient was seen at the [facility] office.     REASON FOR VISIT: Bilateral hips    INTERIM HISTORY: Adrienne Love presents today for orthopedic follow up of her persistent bilateral hip pain. She localizes her pain over the lateral hips with radiation into her lower back. The patient received bilateral hip joint cortisone injections on 06/13/23, which provided 90-95% improvement. This lasted for approximately 2 and half months. Since the last office visit, her symptoms have returned, again her pain is similar without any change.  She is requesting repeat injections again today.    PHYSICAL EXAM:   BMI 32  Constitutional: Adrienne Love is a 53 y.o. female in no acute distress.   Psyc: The patient is alert and oriented x3 with a normal mood and affect.   HENT: AT/NC; hearing intact   Eyes: PERRLA. Extra-ocular movements are intact.   Skin: Normal temperature. There are no rashes, ulcerations, or lesions.   Lymphatic: No induration or enlargement.   Extremities: No swelling or calf tenderness. clubbing. cyanosis.   Gait:  []   Antalgic [x]    Normal []   Trendelenburg   Assistive devices: none    HIP EXAM   Right Hip Exam:   No surgical incisions or scars are noted.  Balance disturbance: []   Yes [x]  No  Snapping or clicking: []   Yes [x]  No  Erythema present: []   Yes [x]  No   Ecchymosis present []  Yes [x]  No  Swelling present: []  Yes [x]  No  Palpable masses: []  Yes [x]  No   Tenderness to palpation: Palpation is negative unless marked below.   []  Iliac crest  [x]  Greater trochanter- mild  []  Ischium  []  SI joint  []  Pubic Symphysis  []  Log roll  ROM(degrees/pain): There is no pain with ROM unless marked below.  Flexion  110?   []  with pain laterally. []  with pain in the groin.  Extension 0?    []  with pain laterally. []  with pain in the groin.  Abduction 30?  [x]  with pain laterally. [x]  with pain in the posterior hip.  Adduction 20?  []  with pain laterally. []  with pain in the groin.  Internal rotation 25?  []  with pain laterally. []  with pain in the groin.  External rotation 45? []  with pain laterally. []  with pain in the groin.  Motor Strength:   Hip flexion 5/5  Hip extension 5/5   Leg abductors (Superior gluteal (L4-S1)) 5/5  Knee extension (Femoral (L2-L4)) 5/5  Knee Flexion (Sciatic (L3-L4))  5/5  Neurovascular:   Sensation is normal throughout  Upper anterior thigh (L2) Normal  Lateral thigh (L2) Normal  Mid anterior thigh (L3) Normal  Knee, inside of leg (L4) Normal  Dorsum of foot (L5) Normal  Lateral border of foot (S1) Normal  Straight leg test:   []  Positive [x]  Negative []  Not Tested  Vascular   DP: Palpable  PT: Palpable  Special Tests:   Impingement (FADDIR): [x]  Positive []  Negative []  Not Tested  Trendelenburg sign:  []  Positive [x]  Negative []  Not Tested  Lower back:   There is no tenderness, deformity or injury. All special test are normal.   FADDIR and FABER cause low back pain    Left Hip Exam:   No surgical incisions  or scars are noted.  Balance disturbance: []  Yes [x]  No  Snapping or clicking: []  Yes [x]  No  Erythema present: []  Yes [x]  No   Ecchymosis present: []  Yes [x]  No  Swelling present: []  Yes [x]  No  Palpable masses: []  Yes [x]  No   Tenderness to palpation: Palpation is negative unless marked below.   []  Iliac crest  [x]  Greater trochanter- mild  []  Ischium  []  SI joint  []  Pubic Symphysis  []  Log roll  ROM(degrees/pain): There is no pain with ROM unless marked below.  Flexion  110?   []  with pain laterally. []  with pain in the groin.  Extension 0?    []  with pain laterally. []  with pain in the groin.  Abduction 30?  []  with pain laterally. []  with pain in the groin.  Adduction 20?  []  with pain laterally. []  with pain in the groin.  Internal rotation 25?  []  with pain laterally. []  with pain in the groin.  External rotation 45? []  with pain laterally. []  with pain in the groin.  Motor Strength:   Hip flexion 5/5  Hip extension 5/5   Leg abductors (Superior gluteal (L4-S1)) 5/5  Knee extension (Femoral (L2-L4)) 5/5  Knee Flexion (Sciatic (L3-L4))  5/5  Neurovascular:   Sensation is normal throughout  Upper anterior thigh (L2) Normal  Lateral thigh (L2) Normal  Mid anterior thigh (L3) Normal  Knee, inside of leg (L4) Normal  Dorsum of foot (L5) Normal  Lateral border of foot (S1) Normal  Straight leg test:   []  Positive [x]  Negative []  Not Tested  Vascular   DP: Palpable  PT: Palpable  Special Tests:   Impingement (FADDIR): [x]  Positive []  Negative []  Not Tested  FABER:   [x]  Positive []  Negative []  Not Tested  Trendelenburg sign:  []  Positive [x]  Negative []  Not Tested      Radiographs: Ap pelvis and four views of the bilateral hip were ordered, obtained and interpreted by me from an orthopedic standpoint here today. They demonstrate moderate osteoarthritis with approximately percent joint space narrowing, osteophyte formation, and subchondral sclerosis.     Impression:   Bilateral hip osteoarthritis, possible degenerative labral tearing  Moderate bilateral hip bursitis  Pacemaker/Defibrillator- Dr. Verneda- non MRI compatible   Osteoporosis/Osteopenia - Prolia injection  Bilateral lumbosacral radiculopathy, L4-L5 radiculopathy  Gastric bypass    Plan: We had a lengthy discussion with the patient today regarding her pain. I discussed with the patient their physical exam findings, radiographs and imaging, above diagnoses, and current treatment options along with their risks and benefits.     THE PATIENT HAS FAILED THE FOLLOWING PRIOR TREATMENT:  []  No previous treatment   [x]  Ice/Heat   []  Cane/Walker  [x]  NSAID   Meloxicam  7.5 mg once daily and Topical Voltaren  gel (1% Diclofenac )   []  Physical therapy   [x]  Cortisone injection   Methylprednisolone   []  Viscous-supplementation injection     []  Bracing     []  Narcotic medication    The patient's subjective complaints and objective findings are consistent with intra-articular hip pain, she may have worse degenerative osteoarthritis with a x-rays reveal or degenerative labral tearing.  However we also discussed that there could be a likelihood that her lower back could be the cause of her pain, and cortisone injections may simply help alleviate pain systemically.  She was recommended to follow up with her lumbar spine specialist, and if after completion of physical therapy could consider diagnostic lumbar spine  injection to rule out pain emanating from her lumbar spine.  The patient we will consider this.    The risks and benefits of a cortisone injection for the Right and Left hip were discussed with the patient and they would like to proceed. At their request, we will proceed as the previous injection did improve their pain significantly and kept them quite functional.  We will try kenalog  injections today as an attempt to provide longer-lasting relief.  Following the injections today she stated that her pain was improved by 100%.    The patient is instructed to use ice, heat, and OTC pain medications as necessary to control any pain or swelling. If the patient notes any increased pain, swelling, bleeding or other abnormalities as discussed today they should contact the office immediately. If she fails to have significant long-lasting relief we may consider Zilretta , viscosupplementation, or CT arthrogram.    We will avoid prescribing oral NSAIDs secondary to her gastric bypass.    Follow up: 3 months        Thank you for allowing me to participate in Kameka Whan Plouffe?s care. Please do not hesitate to contact me if you have any questions or concerns.       Landon D. Krystal LOFTS, PA-C  Physician assistant for Shlomo Sang, D.O.  12/25/2023

## 2023-12-28 MED ORDER — LIDOCAINE 5 % EX PTCH
MEDICATED_PATCH | INTRAMUSCULAR | 0 refills | 30.00000 days | Status: AC
Start: 2023-12-28 — End: ?

## 2023-12-29 ENCOUNTER — Ambulatory Visit: Payer: BLUE CROSS/BLUE SHIELD | Attending: Medical
